# Patient Record
Sex: Male | Born: 1975 | Hispanic: Yes | Marital: Single | Smoking: Never smoker
Health system: Southern US, Community
[De-identification: ages and names within clinical notes are randomized; demographics above are authoritative.]

## PROBLEM LIST (undated history)

## (undated) HISTORY — PX: CHOLECYSTECTOMY: SHX55

---

## 2013-10-15 ENCOUNTER — Inpatient Hospital Stay: Payer: Self-pay | Admitting: Surgery

## 2013-10-15 LAB — CBC WITH DIFFERENTIAL/PLATELET
Basophil #: 0.1 10*3/uL (ref 0.0–0.1)
Basophil %: 0.4 %
Eosinophil #: 0.1 10*3/uL (ref 0.0–0.7)
Eosinophil %: 0.4 %
HCT: 46.7 % (ref 40.0–52.0)
HGB: 15.4 g/dL (ref 13.0–18.0)
LYMPHS PCT: 9.6 %
Lymphocyte #: 1.2 10*3/uL (ref 1.0–3.6)
MCH: 28 pg (ref 26.0–34.0)
MCHC: 33 g/dL (ref 32.0–36.0)
MCV: 85 fL (ref 80–100)
Monocyte #: 0.6 x10 3/mm (ref 0.2–1.0)
Monocyte %: 5 %
Neutrophil #: 10.7 10*3/uL — ABNORMAL HIGH (ref 1.4–6.5)
Neutrophil %: 84.6 %
Platelet: 341 10*3/uL (ref 150–440)
RBC: 5.5 10*6/uL (ref 4.40–5.90)
RDW: 13.6 % (ref 11.5–14.5)
WBC: 12.7 10*3/uL — AB (ref 3.8–10.6)

## 2013-10-15 LAB — AMYLASE: Amylase: >1300 U/L — ABNORMAL HIGH (ref 25–115)

## 2013-10-15 LAB — URINALYSIS, COMPLETE
BACTERIA: NONE SEEN
Blood: NEGATIVE
Glucose,UR: NEGATIVE mg/dL (ref 0–75)
LEUKOCYTE ESTERASE: NEGATIVE
NITRITE: NEGATIVE
Ph: 5 (ref 4.5–8.0)
RBC,UR: <1 /HPF (ref 0–5)
Specific Gravity: 1.017 (ref 1.003–1.030)
Squamous Epithelial: <1
WBC UR: 2 /HPF (ref 0–5)

## 2013-10-15 LAB — COMPREHENSIVE METABOLIC PANEL
ALBUMIN: 4.2 g/dL (ref 3.4–5.0)
ALK PHOS: 218 U/L — AB
Anion Gap: 9 (ref 7–16)
BUN: 10 mg/dL (ref 7–18)
Bilirubin,Total: 5.3 mg/dL — ABNORMAL HIGH (ref 0.2–1.0)
CALCIUM: 9 mg/dL (ref 8.5–10.1)
CREATININE: 1.16 mg/dL (ref 0.60–1.30)
Chloride: 103 mmol/L (ref 98–107)
Co2: 24 mmol/L (ref 21–32)
EGFR (African American): >60
EGFR (Non-African Amer.): >60
Glucose: 140 mg/dL — ABNORMAL HIGH (ref 65–99)
OSMOLALITY: 273 (ref 275–301)
Potassium: 3.5 mmol/L (ref 3.5–5.1)
SGOT(AST): 158 U/L — ABNORMAL HIGH (ref 15–37)
SGPT (ALT): 337 U/L — ABNORMAL HIGH (ref 12–78)
SODIUM: 136 mmol/L (ref 136–145)
TOTAL PROTEIN: 8.2 g/dL (ref 6.4–8.2)

## 2013-10-15 LAB — LACTATE DEHYDROGENASE: LDH: 238 U/L (ref 85–241)

## 2013-10-15 LAB — TROPONIN I

## 2013-10-15 LAB — LIPASE, BLOOD: Lipase: >10000 U/L — ABNORMAL HIGH (ref 73–393)

## 2013-10-15 LAB — PROTIME-INR
INR: 0.9
Prothrombin Time: 12.1 secs (ref 11.5–14.7)

## 2013-10-16 LAB — COMPREHENSIVE METABOLIC PANEL
ALK PHOS: 211 U/L — AB
ANION GAP: 10 (ref 7–16)
Albumin: 3.7 g/dL (ref 3.4–5.0)
BILIRUBIN TOTAL: 6.6 mg/dL — AB (ref 0.2–1.0)
BUN: 14 mg/dL (ref 7–18)
CHLORIDE: 108 mmol/L — AB (ref 98–107)
CO2: 19 mmol/L — AB (ref 21–32)
Calcium, Total: 8.6 mg/dL (ref 8.5–10.1)
Creatinine: 1.42 mg/dL — ABNORMAL HIGH (ref 0.60–1.30)
EGFR (African American): >60
EGFR (Non-African Amer.): >60
Glucose: 114 mg/dL — ABNORMAL HIGH (ref 65–99)
Osmolality: 275 (ref 275–301)
Potassium: 3.8 mmol/L (ref 3.5–5.1)
SGOT(AST): 155 U/L — ABNORMAL HIGH (ref 15–37)
SGPT (ALT): 312 U/L — ABNORMAL HIGH (ref 12–78)
SODIUM: 137 mmol/L (ref 136–145)
Total Protein: 7.1 g/dL (ref 6.4–8.2)

## 2013-10-16 LAB — CBC WITH DIFFERENTIAL/PLATELET
BASOS PCT: 0.5 %
Basophil #: 0.1 10*3/uL (ref 0.0–0.1)
EOS ABS: 0 10*3/uL (ref 0.0–0.7)
EOS PCT: 0 %
HCT: 45.2 % (ref 40.0–52.0)
HGB: 14.5 g/dL (ref 13.0–18.0)
LYMPHS PCT: 3.6 %
Lymphocyte #: 0.7 10*3/uL — ABNORMAL LOW (ref 1.0–3.6)
MCH: 27.4 pg (ref 26.0–34.0)
MCHC: 32.1 g/dL (ref 32.0–36.0)
MCV: 85 fL (ref 80–100)
MONO ABS: 1.1 x10 3/mm — AB (ref 0.2–1.0)
Monocyte %: 6.1 %
Neutrophil #: 16.1 10*3/uL — ABNORMAL HIGH (ref 1.4–6.5)
Neutrophil %: 89.8 %
PLATELETS: 296 10*3/uL (ref 150–440)
RBC: 5.29 10*6/uL (ref 4.40–5.90)
RDW: 13.8 % (ref 11.5–14.5)
WBC: 18 10*3/uL — ABNORMAL HIGH (ref 3.8–10.6)

## 2013-10-16 LAB — MAGNESIUM: Magnesium: 1.7 mg/dL — ABNORMAL LOW

## 2013-10-16 LAB — LIPASE, BLOOD: Lipase: >10000 U/L — ABNORMAL HIGH (ref 73–393)

## 2013-10-16 LAB — AMYLASE: Amylase: 859 U/L — ABNORMAL HIGH (ref 25–115)

## 2013-10-17 LAB — CBC WITH DIFFERENTIAL/PLATELET
BASOS ABS: 0 10*3/uL (ref 0.0–0.1)
BASOS PCT: 0.3 %
EOS ABS: 0 10*3/uL (ref 0.0–0.7)
EOS PCT: 0 %
HCT: 40 % (ref 40.0–52.0)
HGB: 12.8 g/dL — AB (ref 13.0–18.0)
LYMPHS ABS: 0.9 10*3/uL — AB (ref 1.0–3.6)
Lymphocyte %: 6.7 %
MCH: 27.5 pg (ref 26.0–34.0)
MCHC: 32 g/dL (ref 32.0–36.0)
MCV: 86 fL (ref 80–100)
Monocyte #: 1 x10 3/mm (ref 0.2–1.0)
Monocyte %: 7 %
NEUTROS ABS: 12 10*3/uL — AB (ref 1.4–6.5)
Neutrophil %: 86 %
PLATELETS: 198 10*3/uL (ref 150–440)
RBC: 4.66 10*6/uL (ref 4.40–5.90)
RDW: 14.2 % (ref 11.5–14.5)
WBC: 14 10*3/uL — AB (ref 3.8–10.6)

## 2013-10-17 LAB — COMPREHENSIVE METABOLIC PANEL
ANION GAP: 5 — AB (ref 7–16)
AST: 75 U/L — AB (ref 15–37)
Albumin: 2.9 g/dL — ABNORMAL LOW (ref 3.4–5.0)
Alkaline Phosphatase: 167 U/L — ABNORMAL HIGH
BILIRUBIN TOTAL: 7.8 mg/dL — AB (ref 0.2–1.0)
BUN: 8 mg/dL (ref 7–18)
CHLORIDE: 104 mmol/L (ref 98–107)
Calcium, Total: 7.9 mg/dL — ABNORMAL LOW (ref 8.5–10.1)
Co2: 27 mmol/L (ref 21–32)
Creatinine: 1.08 mg/dL (ref 0.60–1.30)
EGFR (African American): >60
Glucose: 104 mg/dL — ABNORMAL HIGH (ref 65–99)
OSMOLALITY: 271 (ref 275–301)
Potassium: 3.7 mmol/L (ref 3.5–5.1)
SGPT (ALT): 207 U/L — ABNORMAL HIGH (ref 12–78)
Sodium: 136 mmol/L (ref 136–145)
TOTAL PROTEIN: 6.5 g/dL (ref 6.4–8.2)

## 2013-10-17 LAB — AMYLASE: AMYLASE: 410 U/L — AB (ref 25–115)

## 2013-10-17 LAB — LACTATE DEHYDROGENASE: LDH: 215 U/L (ref 85–241)

## 2013-10-18 LAB — CBC WITH DIFFERENTIAL/PLATELET
Basophil #: 0 10*3/uL (ref 0.0–0.1)
Basophil %: 0.3 %
EOS ABS: 0 10*3/uL (ref 0.0–0.7)
EOS PCT: 0 %
HCT: 38.4 % — AB (ref 40.0–52.0)
HGB: 12.8 g/dL — AB (ref 13.0–18.0)
LYMPHS PCT: 6 %
Lymphocyte #: 0.9 10*3/uL — ABNORMAL LOW (ref 1.0–3.6)
MCH: 28.3 pg (ref 26.0–34.0)
MCHC: 33.4 g/dL (ref 32.0–36.0)
MCV: 85 fL (ref 80–100)
MONOS PCT: 7.8 %
Monocyte #: 1.1 x10 3/mm — ABNORMAL HIGH (ref 0.2–1.0)
NEUTROS PCT: 85.9 %
Neutrophil #: 12.4 10*3/uL — ABNORMAL HIGH (ref 1.4–6.5)
Platelet: 199 10*3/uL (ref 150–440)
RBC: 4.53 10*6/uL (ref 4.40–5.90)
RDW: 14.3 % (ref 11.5–14.5)
WBC: 14.4 10*3/uL — AB (ref 3.8–10.6)

## 2013-10-18 LAB — COMPREHENSIVE METABOLIC PANEL
ALK PHOS: 159 U/L — AB
AST: 48 U/L — AB (ref 15–37)
Albumin: 2.7 g/dL — ABNORMAL LOW (ref 3.4–5.0)
Anion Gap: 6 — ABNORMAL LOW (ref 7–16)
BUN: 8 mg/dL (ref 7–18)
Bilirubin,Total: 4.9 mg/dL — ABNORMAL HIGH (ref 0.2–1.0)
CHLORIDE: 102 mmol/L (ref 98–107)
CO2: 27 mmol/L (ref 21–32)
CREATININE: 1.05 mg/dL (ref 0.60–1.30)
Calcium, Total: 7.9 mg/dL — ABNORMAL LOW (ref 8.5–10.1)
Glucose: 100 mg/dL — ABNORMAL HIGH (ref 65–99)
Osmolality: 269 (ref 275–301)
Potassium: 3.4 mmol/L — ABNORMAL LOW (ref 3.5–5.1)
SGPT (ALT): 147 U/L — ABNORMAL HIGH (ref 12–78)
Sodium: 135 mmol/L — ABNORMAL LOW (ref 136–145)
TOTAL PROTEIN: 6.9 g/dL (ref 6.4–8.2)

## 2013-10-18 LAB — LIPASE, BLOOD: Lipase: 1087 U/L — ABNORMAL HIGH (ref 73–393)

## 2013-10-19 LAB — COMPREHENSIVE METABOLIC PANEL
ALK PHOS: 134 U/L — AB
AST: 33 U/L (ref 15–37)
Albumin: 2.6 g/dL — ABNORMAL LOW (ref 3.4–5.0)
Anion Gap: 7 (ref 7–16)
BUN: 8 mg/dL (ref 7–18)
Bilirubin,Total: 2.7 mg/dL — ABNORMAL HIGH (ref 0.2–1.0)
Calcium, Total: 8.1 mg/dL — ABNORMAL LOW (ref 8.5–10.1)
Chloride: 99 mmol/L (ref 98–107)
Co2: 30 mmol/L (ref 21–32)
Creatinine: 0.9 mg/dL (ref 0.60–1.30)
EGFR (African American): >60
EGFR (Non-African Amer.): >60
Glucose: 88 mg/dL (ref 65–99)
OSMOLALITY: 270 (ref 275–301)
POTASSIUM: 2.8 mmol/L — AB (ref 3.5–5.1)
SGPT (ALT): 109 U/L — ABNORMAL HIGH (ref 12–78)
SODIUM: 136 mmol/L (ref 136–145)
Total Protein: 7.1 g/dL (ref 6.4–8.2)

## 2013-10-19 LAB — CBC WITH DIFFERENTIAL/PLATELET
BASOS ABS: 0.1 10*3/uL (ref 0.0–0.1)
Basophil %: 0.4 %
Eosinophil #: 0.1 10*3/uL (ref 0.0–0.7)
Eosinophil %: 0.9 %
HCT: 35.4 % — ABNORMAL LOW (ref 40.0–52.0)
HGB: 11.8 g/dL — AB (ref 13.0–18.0)
LYMPHS ABS: 1.2 10*3/uL (ref 1.0–3.6)
LYMPHS PCT: 9.7 %
MCH: 28.4 pg (ref 26.0–34.0)
MCHC: 33.3 g/dL (ref 32.0–36.0)
MCV: 85 fL (ref 80–100)
MONO ABS: 1.2 x10 3/mm — AB (ref 0.2–1.0)
Monocyte %: 9.3 %
NEUTROS ABS: 9.9 10*3/uL — AB (ref 1.4–6.5)
NEUTROS PCT: 79.7 %
Platelet: 227 10*3/uL (ref 150–440)
RBC: 4.15 10*6/uL — ABNORMAL LOW (ref 4.40–5.90)
RDW: 13.8 % (ref 11.5–14.5)
WBC: 12.4 10*3/uL — ABNORMAL HIGH (ref 3.8–10.6)

## 2013-10-19 LAB — POTASSIUM: Potassium: 3 mmol/L — ABNORMAL LOW (ref 3.5–5.1)

## 2013-10-19 LAB — LIPASE, BLOOD: Lipase: 414 U/L — ABNORMAL HIGH (ref 73–393)

## 2013-10-20 LAB — CBC WITH DIFFERENTIAL/PLATELET
BASOS ABS: 0.1 10*3/uL (ref 0.0–0.1)
BASOS PCT: 0.5 %
Eosinophil #: 0.3 10*3/uL (ref 0.0–0.7)
Eosinophil %: 2.8 %
HCT: 34 % — AB (ref 40.0–52.0)
HGB: 11.1 g/dL — AB (ref 13.0–18.0)
LYMPHS PCT: 10.7 %
Lymphocyte #: 1.3 10*3/uL (ref 1.0–3.6)
MCH: 27.9 pg (ref 26.0–34.0)
MCHC: 32.6 g/dL (ref 32.0–36.0)
MCV: 86 fL (ref 80–100)
MONOS PCT: 9.4 %
Monocyte #: 1.1 x10 3/mm — ABNORMAL HIGH (ref 0.2–1.0)
NEUTROS PCT: 76.6 %
Neutrophil #: 9.3 10*3/uL — ABNORMAL HIGH (ref 1.4–6.5)
PLATELETS: 245 10*3/uL (ref 150–440)
RBC: 3.97 10*6/uL — ABNORMAL LOW (ref 4.40–5.90)
RDW: 14 % (ref 11.5–14.5)
WBC: 12.1 10*3/uL — ABNORMAL HIGH (ref 3.8–10.6)

## 2013-10-20 LAB — COMPREHENSIVE METABOLIC PANEL
ALBUMIN: 2.5 g/dL — AB (ref 3.4–5.0)
ALK PHOS: 112 U/L
Anion Gap: 6 — ABNORMAL LOW (ref 7–16)
BUN: 6 mg/dL — ABNORMAL LOW (ref 7–18)
Bilirubin,Total: 1.6 mg/dL — ABNORMAL HIGH (ref 0.2–1.0)
CO2: 30 mmol/L (ref 21–32)
Calcium, Total: 7.8 mg/dL — ABNORMAL LOW (ref 8.5–10.1)
Chloride: 102 mmol/L (ref 98–107)
Creatinine: 0.91 mg/dL (ref 0.60–1.30)
EGFR (African American): >60
EGFR (Non-African Amer.): >60
Glucose: 120 mg/dL — ABNORMAL HIGH (ref 65–99)
Osmolality: 274 (ref 275–301)
POTASSIUM: 3 mmol/L — AB (ref 3.5–5.1)
SGOT(AST): 33 U/L (ref 15–37)
SGPT (ALT): 83 U/L — ABNORMAL HIGH (ref 12–78)
Sodium: 138 mmol/L (ref 136–145)
TOTAL PROTEIN: 6.8 g/dL (ref 6.4–8.2)

## 2013-10-20 LAB — LIPASE, BLOOD: Lipase: 396 U/L — ABNORMAL HIGH (ref 73–393)

## 2013-10-20 LAB — CLOSTRIDIUM DIFFICILE(ARMC)

## 2013-10-20 LAB — PATHOLOGY REPORT

## 2013-10-21 LAB — CBC WITH DIFFERENTIAL/PLATELET
Basophil #: 0.1 10*3/uL
Basophil %: 0.7 %
Eosinophil #: 0.3 10*3/uL
Eosinophil %: 2.7 %
HCT: 32.9 % — ABNORMAL LOW
HGB: 10.9 g/dL — ABNORMAL LOW
Lymphocyte %: 6.3 %
Lymphs Abs: 0.8 10*3/uL — ABNORMAL LOW
MCH: 28.3 pg
MCHC: 33 g/dL
MCV: 86 fL
Monocyte #: 0.9 10*3/uL
Monocyte %: 7.3 %
Neutrophil #: 10.6 10*3/uL — ABNORMAL HIGH
Neutrophil %: 83 %
Platelet: 257 10*3/uL
RBC: 3.85 x10 6/mm 3 — ABNORMAL LOW
RDW: 13.6 %
WBC: 12.7 10*3/uL — ABNORMAL HIGH

## 2013-10-21 LAB — COMPREHENSIVE METABOLIC PANEL
ALBUMIN: 2.2 g/dL — AB (ref 3.4–5.0)
ALT: 70 U/L (ref 12–78)
AST: 33 U/L (ref 15–37)
Alkaline Phosphatase: 100 U/L
Anion Gap: 8 (ref 7–16)
BUN: 5 mg/dL — ABNORMAL LOW (ref 7–18)
Bilirubin,Total: 1.2 mg/dL — ABNORMAL HIGH (ref 0.2–1.0)
CHLORIDE: 99 mmol/L (ref 98–107)
CREATININE: 0.85 mg/dL (ref 0.60–1.30)
Calcium, Total: 8.1 mg/dL — ABNORMAL LOW (ref 8.5–10.1)
Co2: 28 mmol/L (ref 21–32)
EGFR (African American): >60
EGFR (Non-African Amer.): >60
GLUCOSE: 97 mg/dL (ref 65–99)
Osmolality: 267 (ref 275–301)
Potassium: 3.1 mmol/L — ABNORMAL LOW (ref 3.5–5.1)
SODIUM: 135 mmol/L — AB (ref 136–145)
Total Protein: 6.5 g/dL (ref 6.4–8.2)

## 2013-10-21 LAB — LIPASE, BLOOD: Lipase: 397 U/L — ABNORMAL HIGH (ref 73–393)

## 2013-10-22 LAB — LIPASE, BLOOD: Lipase: 463 U/L — ABNORMAL HIGH (ref 73–393)

## 2013-10-22 LAB — CBC WITH DIFFERENTIAL/PLATELET
Basophil #: 0.1 10*3/uL (ref 0.0–0.1)
Basophil %: 0.4 %
Eosinophil #: 0.4 10*3/uL (ref 0.0–0.7)
Eosinophil %: 2.8 %
HCT: 31.2 % — ABNORMAL LOW (ref 40.0–52.0)
HGB: 10.2 g/dL — AB (ref 13.0–18.0)
LYMPHS ABS: 1.4 10*3/uL (ref 1.0–3.6)
Lymphocyte %: 9.3 %
MCH: 28.1 pg (ref 26.0–34.0)
MCHC: 32.7 g/dL (ref 32.0–36.0)
MCV: 86 fL (ref 80–100)
MONO ABS: 1.3 x10 3/mm — AB (ref 0.2–1.0)
Monocyte %: 8.9 %
NEUTROS PCT: 78.6 %
Neutrophil #: 11.8 10*3/uL — ABNORMAL HIGH (ref 1.4–6.5)
PLATELETS: 297 10*3/uL (ref 150–440)
RBC: 3.63 10*6/uL — AB (ref 4.40–5.90)
RDW: 13.7 % (ref 11.5–14.5)
WBC: 15 10*3/uL — ABNORMAL HIGH (ref 3.8–10.6)

## 2013-10-22 LAB — COMPREHENSIVE METABOLIC PANEL
ALBUMIN: 2.3 g/dL — AB (ref 3.4–5.0)
ALK PHOS: 96 U/L
ALT: 56 U/L (ref 12–78)
Anion Gap: 6 — ABNORMAL LOW (ref 7–16)
BILIRUBIN TOTAL: 1.2 mg/dL — AB (ref 0.2–1.0)
BUN: 7 mg/dL (ref 7–18)
Calcium, Total: 8.7 mg/dL (ref 8.5–10.1)
Chloride: 99 mmol/L (ref 98–107)
Co2: 31 mmol/L (ref 21–32)
Creatinine: 1.12 mg/dL (ref 0.60–1.30)
EGFR (Non-African Amer.): >60
Glucose: 98 mg/dL (ref 65–99)
Osmolality: 270 (ref 275–301)
POTASSIUM: 3.3 mmol/L — AB (ref 3.5–5.1)
SGOT(AST): 22 U/L (ref 15–37)
Sodium: 136 mmol/L (ref 136–145)
Total Protein: 6.8 g/dL (ref 6.4–8.2)

## 2013-10-23 LAB — COMPREHENSIVE METABOLIC PANEL
ALBUMIN: 2.3 g/dL — AB (ref 3.4–5.0)
ANION GAP: 7 (ref 7–16)
AST: 26 U/L (ref 15–37)
Alkaline Phosphatase: 94 U/L
BUN: 7 mg/dL (ref 7–18)
Bilirubin,Total: 1 mg/dL (ref 0.2–1.0)
Calcium, Total: 8.7 mg/dL (ref 8.5–10.1)
Chloride: 99 mmol/L (ref 98–107)
Co2: 29 mmol/L (ref 21–32)
Creatinine: 0.97 mg/dL (ref 0.60–1.30)
EGFR (African American): >60
EGFR (Non-African Amer.): >60
GLUCOSE: 98 mg/dL (ref 65–99)
Osmolality: 268 (ref 275–301)
POTASSIUM: 3.2 mmol/L — AB (ref 3.5–5.1)
SGPT (ALT): 55 U/L (ref 12–78)
Sodium: 135 mmol/L — ABNORMAL LOW (ref 136–145)
TOTAL PROTEIN: 7 g/dL (ref 6.4–8.2)

## 2013-10-23 LAB — CBC WITH DIFFERENTIAL/PLATELET
BASOS ABS: 0.1 10*3/uL (ref 0.0–0.1)
Basophil %: 0.3 %
Eosinophil #: 0.5 10*3/uL (ref 0.0–0.7)
Eosinophil %: 3.1 %
HCT: 33.3 % — ABNORMAL LOW (ref 40.0–52.0)
HGB: 10.9 g/dL — ABNORMAL LOW (ref 13.0–18.0)
Lymphocyte #: 1.4 10*3/uL (ref 1.0–3.6)
Lymphocyte %: 9 %
MCH: 28.1 pg (ref 26.0–34.0)
MCHC: 32.6 g/dL (ref 32.0–36.0)
MCV: 86 fL (ref 80–100)
MONO ABS: 1.2 x10 3/mm — AB (ref 0.2–1.0)
Monocyte %: 7.9 %
Neutrophil #: 12.5 10*3/uL — ABNORMAL HIGH (ref 1.4–6.5)
Neutrophil %: 79.7 %
PLATELETS: 381 10*3/uL (ref 150–440)
RBC: 3.87 10*6/uL — ABNORMAL LOW (ref 4.40–5.90)
RDW: 13.7 % (ref 11.5–14.5)
WBC: 15.7 10*3/uL — ABNORMAL HIGH (ref 3.8–10.6)

## 2013-10-23 LAB — LIPASE, BLOOD: Lipase: 525 U/L — ABNORMAL HIGH (ref 73–393)

## 2013-10-24 LAB — CBC WITH DIFFERENTIAL/PLATELET
BASOS PCT: 0.3 %
Basophil #: 0 10*3/uL (ref 0.0–0.1)
Eosinophil #: 0.6 10*3/uL (ref 0.0–0.7)
Eosinophil %: 4 %
HCT: 31.4 % — ABNORMAL LOW (ref 40.0–52.0)
HGB: 10.4 g/dL — ABNORMAL LOW (ref 13.0–18.0)
LYMPHS ABS: 1.8 10*3/uL (ref 1.0–3.6)
Lymphocyte %: 12.6 %
MCH: 28.3 pg (ref 26.0–34.0)
MCHC: 33.2 g/dL (ref 32.0–36.0)
MCV: 85 fL (ref 80–100)
MONO ABS: 1.4 x10 3/mm — AB (ref 0.2–1.0)
Monocyte %: 9.8 %
Neutrophil #: 10.4 10*3/uL — ABNORMAL HIGH (ref 1.4–6.5)
Neutrophil %: 73.3 %
Platelet: 409 10*3/uL (ref 150–440)
RBC: 3.68 10*6/uL — ABNORMAL LOW (ref 4.40–5.90)
RDW: 14 % (ref 11.5–14.5)
WBC: 14.2 10*3/uL — AB (ref 3.8–10.6)

## 2013-10-24 LAB — COMPREHENSIVE METABOLIC PANEL
ALBUMIN: 2.4 g/dL — AB (ref 3.4–5.0)
ALK PHOS: 83 U/L
AST: 39 U/L — AB (ref 15–37)
Anion Gap: 5 — ABNORMAL LOW (ref 7–16)
BUN: 8 mg/dL (ref 7–18)
Bilirubin,Total: 0.9 mg/dL (ref 0.2–1.0)
CO2: 29 mmol/L (ref 21–32)
CREATININE: 0.98 mg/dL (ref 0.60–1.30)
Calcium, Total: 8.6 mg/dL (ref 8.5–10.1)
Chloride: 101 mmol/L (ref 98–107)
Glucose: 106 mg/dL — ABNORMAL HIGH (ref 65–99)
Osmolality: 269 (ref 275–301)
Potassium: 3.6 mmol/L (ref 3.5–5.1)
SGPT (ALT): 65 U/L (ref 12–78)
Sodium: 135 mmol/L — ABNORMAL LOW (ref 136–145)
TOTAL PROTEIN: 7 g/dL (ref 6.4–8.2)

## 2013-10-24 LAB — LIPASE, BLOOD: LIPASE: 595 U/L — AB (ref 73–393)

## 2013-10-28 ENCOUNTER — Other Ambulatory Visit: Payer: Self-pay | Admitting: Surgery

## 2013-10-28 LAB — CBC WITH DIFFERENTIAL/PLATELET
Eosinophil: 3 %
HCT: 38.6 % — ABNORMAL LOW (ref 40.0–52.0)
HGB: 12.9 g/dL — ABNORMAL LOW (ref 13.0–18.0)
Lymphocytes: 19 %
MCH: 28.2 pg (ref 26.0–34.0)
MCHC: 33.3 g/dL (ref 32.0–36.0)
MCV: 85 fL (ref 80–100)
MONOS PCT: 10 %
Platelet: 645 10*3/uL — ABNORMAL HIGH (ref 150–440)
RBC: 4.57 10*6/uL (ref 4.40–5.90)
RDW: 13.5 % (ref 11.5–14.5)
Segmented Neutrophils: 68 %
WBC: 6.9 10*3/uL (ref 3.8–10.6)

## 2013-10-28 LAB — LIPASE, BLOOD: Lipase: 682 U/L — ABNORMAL HIGH (ref 73–393)

## 2013-10-28 LAB — BASIC METABOLIC PANEL
Anion Gap: 6 — ABNORMAL LOW (ref 7–16)
BUN: 10 mg/dL (ref 7–18)
CO2: 27 mmol/L (ref 21–32)
CREATININE: 0.95 mg/dL (ref 0.60–1.30)
Calcium, Total: 9.4 mg/dL (ref 8.5–10.1)
Chloride: 103 mmol/L (ref 98–107)
Glucose: 118 mg/dL — ABNORMAL HIGH (ref 65–99)
Osmolality: 272 (ref 275–301)
Potassium: 4.3 mmol/L (ref 3.5–5.1)
Sodium: 136 mmol/L (ref 136–145)

## 2013-10-28 LAB — HEPATIC FUNCTION PANEL A (ARMC)
ALBUMIN: 3.4 g/dL (ref 3.4–5.0)
AST: 39 U/L — AB (ref 15–37)
Alkaline Phosphatase: 107 U/L
Bilirubin, Direct: 0.6 mg/dL — ABNORMAL HIGH (ref 0.00–0.20)
Bilirubin,Total: 0.8 mg/dL (ref 0.2–1.0)
SGPT (ALT): 58 U/L (ref 12–78)
Total Protein: 8.6 g/dL — ABNORMAL HIGH (ref 6.4–8.2)

## 2013-11-01 ENCOUNTER — Other Ambulatory Visit: Payer: Self-pay | Admitting: Surgery

## 2013-11-01 LAB — HEPATIC FUNCTION PANEL A (ARMC)
Albumin: 3.7 g/dL (ref 3.4–5.0)
Alkaline Phosphatase: 110 U/L
Bilirubin, Direct: 0.6 mg/dL — ABNORMAL HIGH (ref 0.00–0.20)
Bilirubin,Total: 0.8 mg/dL (ref 0.2–1.0)
SGOT(AST): 29 U/L (ref 15–37)
SGPT (ALT): 54 U/L (ref 12–78)
Total Protein: 8.7 g/dL — ABNORMAL HIGH (ref 6.4–8.2)

## 2013-11-01 LAB — BASIC METABOLIC PANEL
Anion Gap: 7 (ref 7–16)
BUN: 7 mg/dL (ref 7–18)
CREATININE: 1.07 mg/dL (ref 0.60–1.30)
Calcium, Total: 10 mg/dL (ref 8.5–10.1)
Chloride: 103 mmol/L (ref 98–107)
Co2: 27 mmol/L (ref 21–32)
EGFR (African American): >60
EGFR (Non-African Amer.): >60
GLUCOSE: 109 mg/dL — AB (ref 65–99)
OSMOLALITY: 272 (ref 275–301)
POTASSIUM: 4.3 mmol/L (ref 3.5–5.1)
SODIUM: 137 mmol/L (ref 136–145)

## 2013-11-01 LAB — CBC WITH DIFFERENTIAL/PLATELET
BASOS ABS: 0 10*3/uL (ref 0.0–0.1)
Basophil %: 0.8 %
EOS PCT: 5.9 %
Eosinophil #: 0.4 10*3/uL (ref 0.0–0.7)
HCT: 40 % (ref 40.0–52.0)
HGB: 13 g/dL (ref 13.0–18.0)
LYMPHS ABS: 2.2 10*3/uL (ref 1.0–3.6)
Lymphocyte %: 35 %
MCH: 28.1 pg (ref 26.0–34.0)
MCHC: 32.6 g/dL (ref 32.0–36.0)
MCV: 86 fL (ref 80–100)
MONO ABS: 0.4 x10 3/mm (ref 0.2–1.0)
Monocyte %: 6.3 %
NEUTROS ABS: 3.2 10*3/uL (ref 1.4–6.5)
NEUTROS PCT: 52 %
PLATELETS: 639 10*3/uL — AB (ref 150–440)
RBC: 4.65 10*6/uL (ref 4.40–5.90)
RDW: 13.7 % (ref 11.5–14.5)
WBC: 6.2 10*3/uL (ref 3.8–10.6)

## 2013-11-01 LAB — LIPASE, BLOOD: Lipase: 511 U/L — ABNORMAL HIGH (ref 73–393)

## 2013-11-08 ENCOUNTER — Other Ambulatory Visit: Payer: Self-pay | Admitting: Surgery

## 2013-11-08 LAB — CBC WITH DIFFERENTIAL/PLATELET
BASOS ABS: 0 10*3/uL (ref 0.0–0.1)
BASOS PCT: 1.3 %
EOS ABS: 0.1 10*3/uL (ref 0.0–0.7)
Eosinophil %: 3 %
HCT: 41 % (ref 40.0–52.0)
HGB: 13.2 g/dL (ref 13.0–18.0)
Lymphocyte #: 1.5 10*3/uL (ref 1.0–3.6)
Lymphocyte %: 39 %
MCH: 27.7 pg (ref 26.0–34.0)
MCHC: 32.3 g/dL (ref 32.0–36.0)
MCV: 86 fL (ref 80–100)
MONO ABS: 0.3 x10 3/mm (ref 0.2–1.0)
Monocyte %: 7.6 %
Neutrophil #: 1.8 10*3/uL (ref 1.4–6.5)
Neutrophil %: 49.1 %
Platelet: 343 10*3/uL (ref 150–440)
RBC: 4.79 10*6/uL (ref 4.40–5.90)
RDW: 14.6 % — ABNORMAL HIGH (ref 11.5–14.5)
WBC: 3.7 10*3/uL — AB (ref 3.8–10.6)

## 2013-11-08 LAB — LIPASE, BLOOD: Lipase: 258 U/L (ref 73–393)

## 2014-08-28 IMAGING — CR DG ABDOMEN 3V
1 series · 5 of 5 positions shown · non-contrast
Comparison: None.

CLINICAL DATA: Abdominal pain.

EXAM:
ABDOMEN SERIES

[Series 1: w chest pa · 0.14mm/px · 5 of 5 slices shown]
[im 1/5]
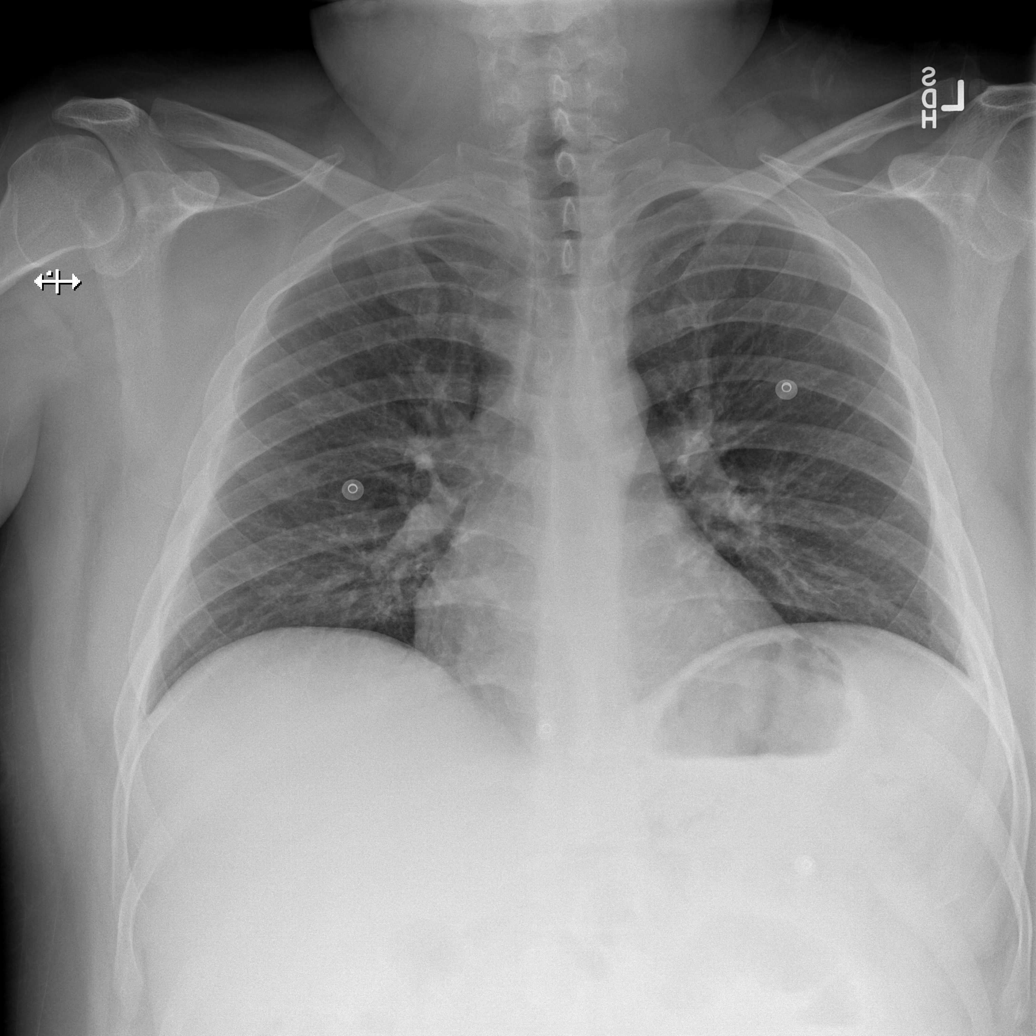
[im 2/5]
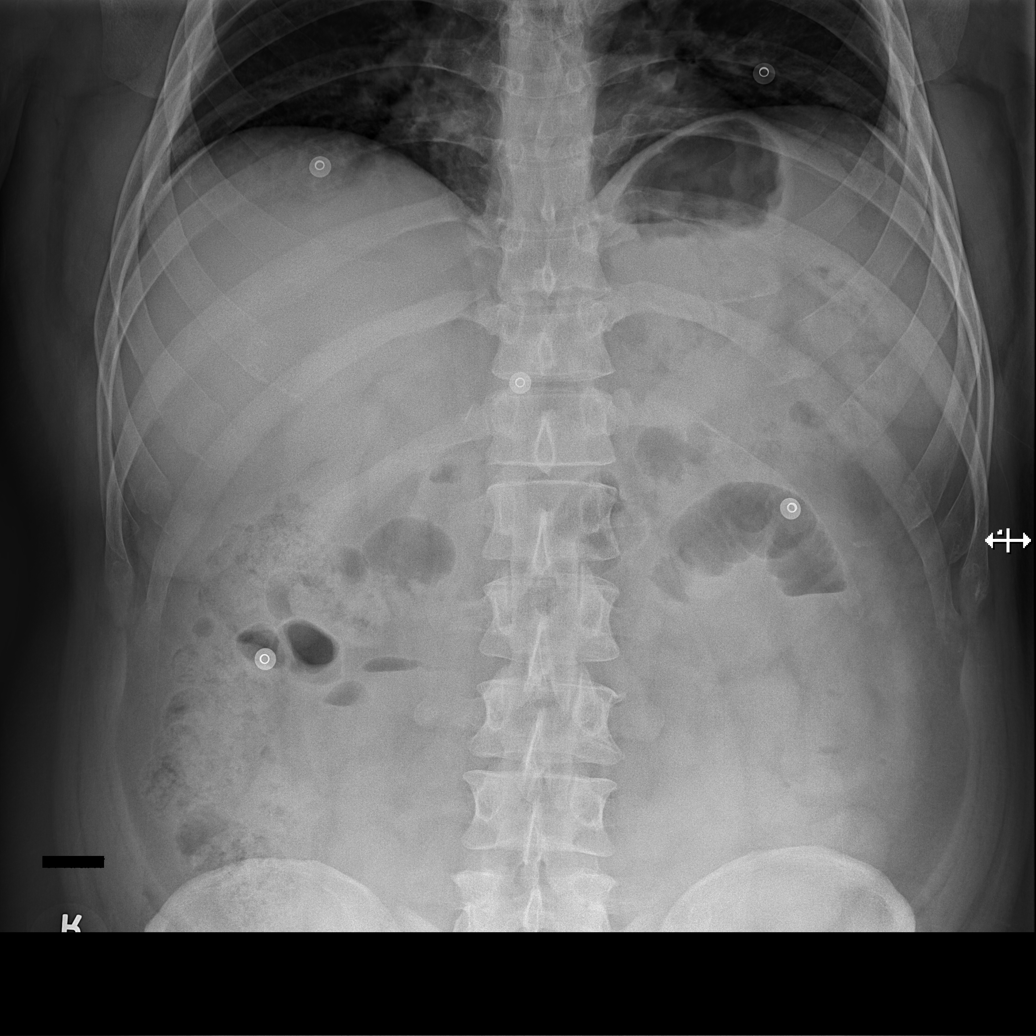
[im 3/5]
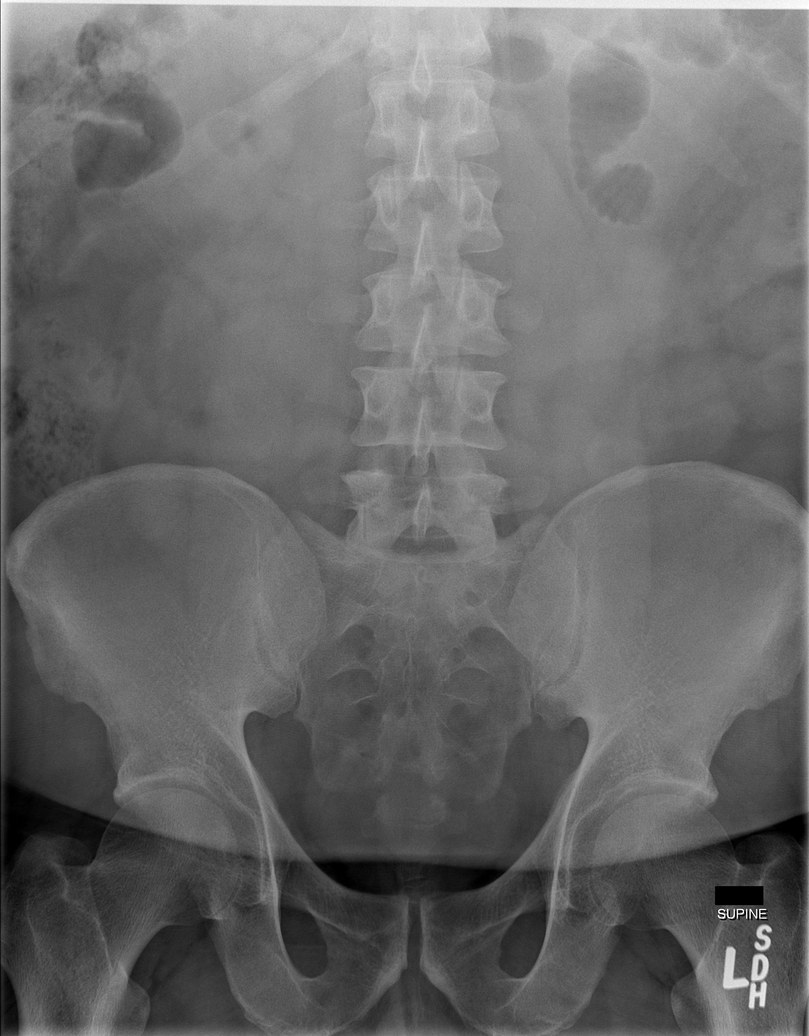
[im 4/5]
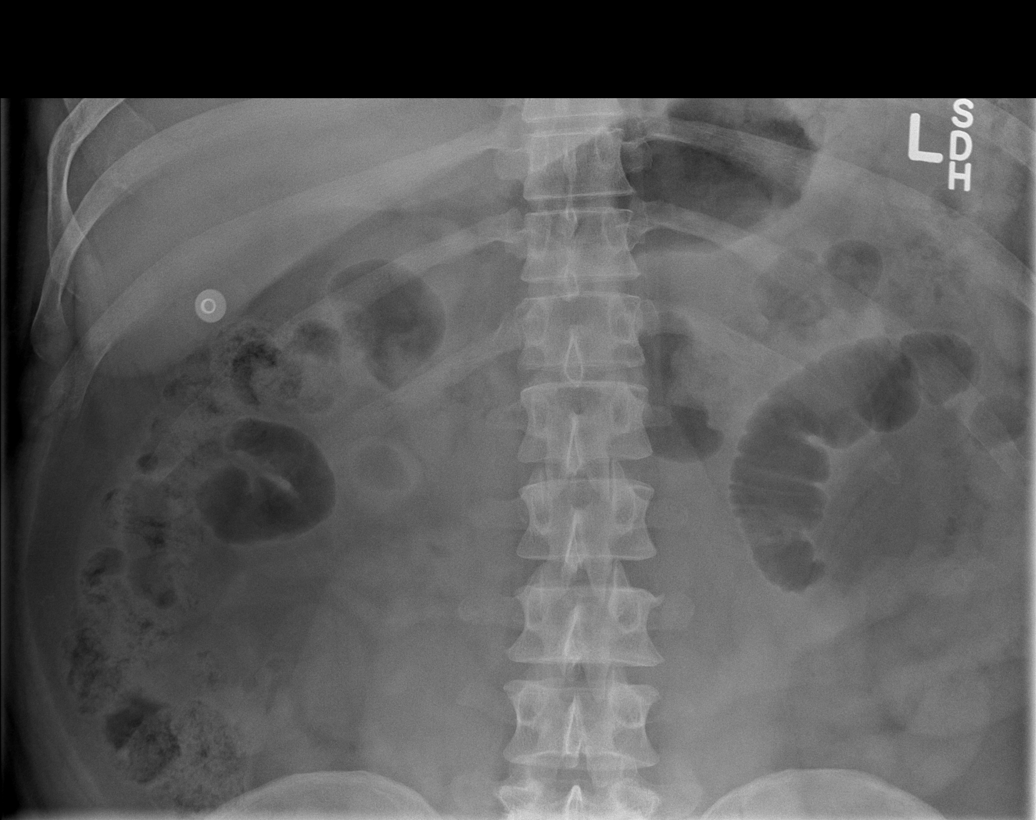
[im 5/5]
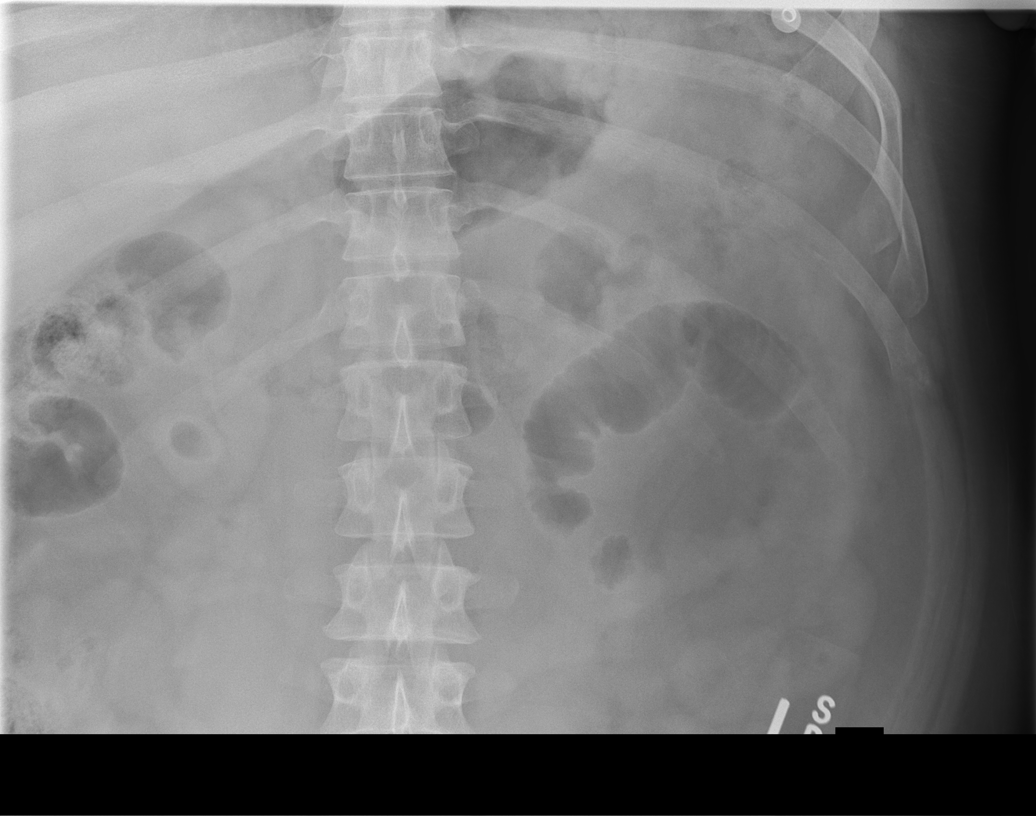

[5 of 5 positions shown; findings below may reference images not displayed]

FINDINGS: Low lung volumes. Normal cardiomediastinal silhouette. No
infiltrates or failure. No effusion or pneumothorax.

Slight prominence mid abdominal small bowel loop to the left of
midline could represent ileus. No visible obstruction or free air.
Normal osseous structures.
IMPRESSION: Low lung volumes. Possible ileus. No active infiltrates or
obstruction/free air.

## 2014-09-03 IMAGING — CT CT ABD-PELV W/ CM
2 of 5 series · 16 of 46 positions shown, 18 images · IV contrast (isovue)
Comparison: None.

CLINICAL DATA: Fever, pancreatitis, recent cholecystectomy

EXAM:
CT ABDOMEN AND PELVIS WITH CONTRAST
TECHNIQUE: Multidetector CT imaging of the abdomen and pelvis was performed
using the standard protocol following bolus administration of
intravenous contrast.
CONTRAST:  125 cc Isovue

[Series 2: routine abd pel with · axial · 0.98mm/px · z∈[-926,-476]mm · 13 of 104 slices shown, 15 images]
[im 7/104  soft-tissue]
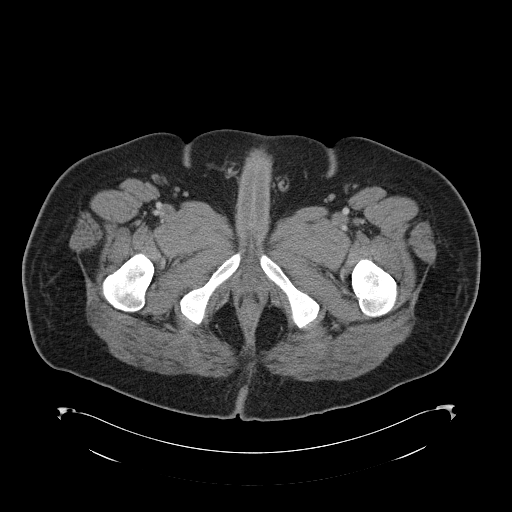
[im 7/104  bone]
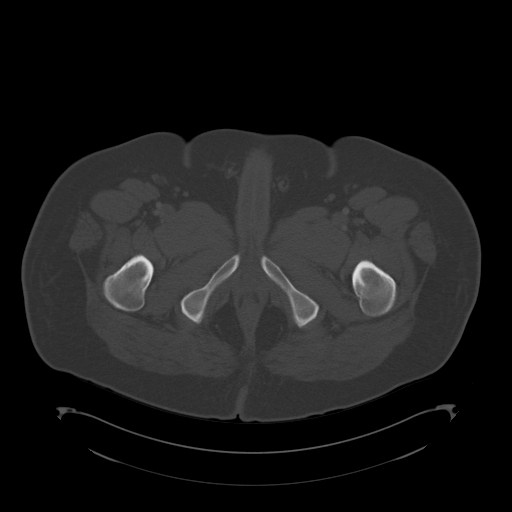
[im 13/104  soft-tissue]
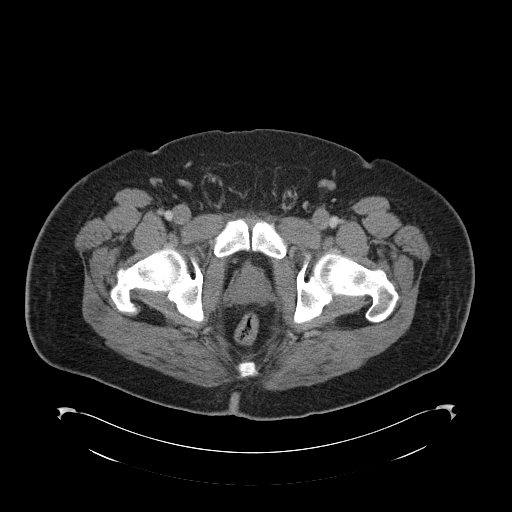
[im 20/104  soft-tissue]
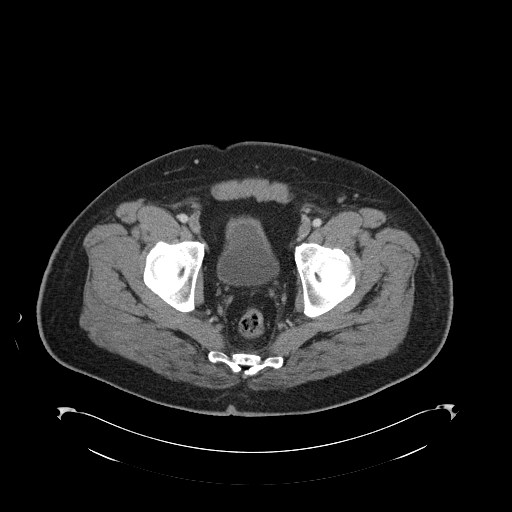
[im 33/104  soft-tissue]
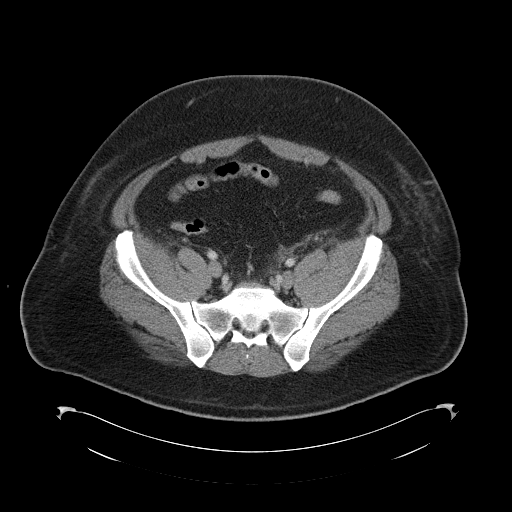
[im 39/104  soft-tissue]
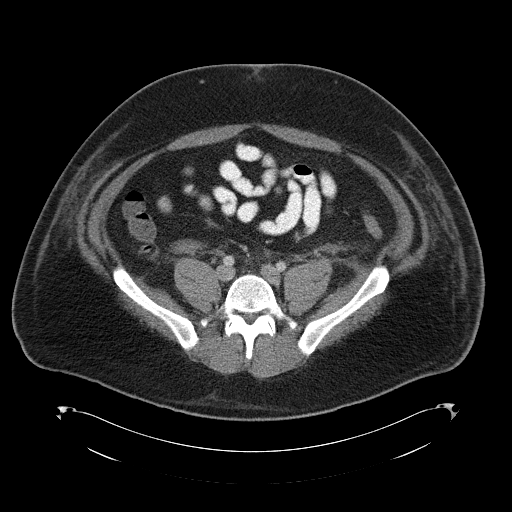
[im 46/104  soft-tissue]
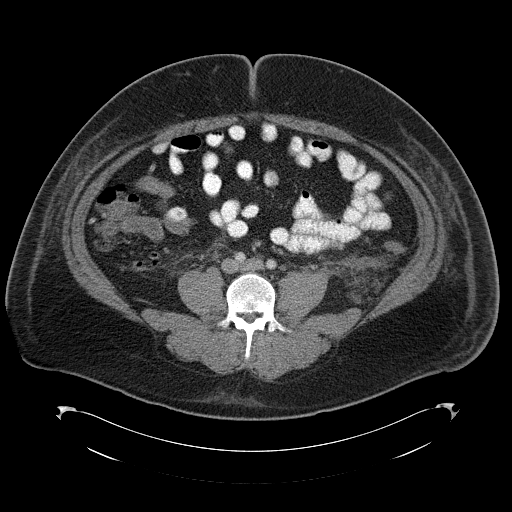
[im 52/104  soft-tissue]
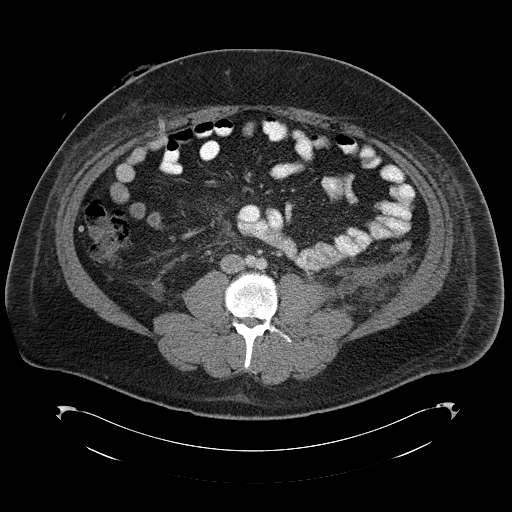
[im 58/104  soft-tissue]
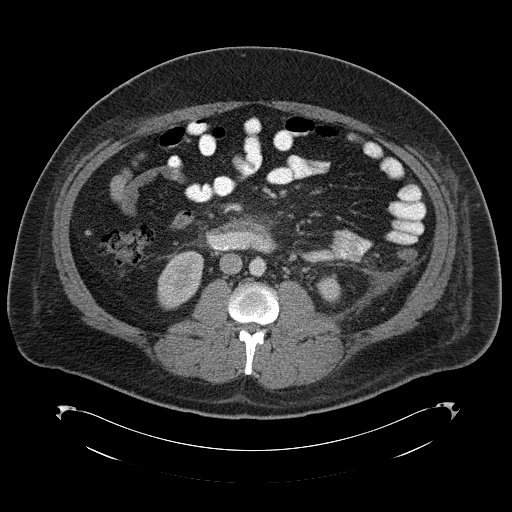
[im 65/104  soft-tissue]
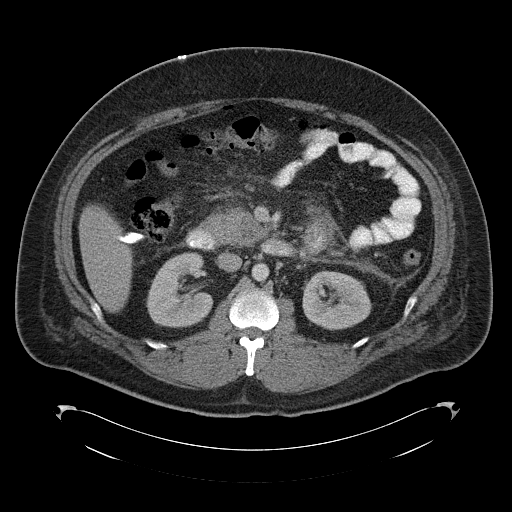
[im 65/104  bone]
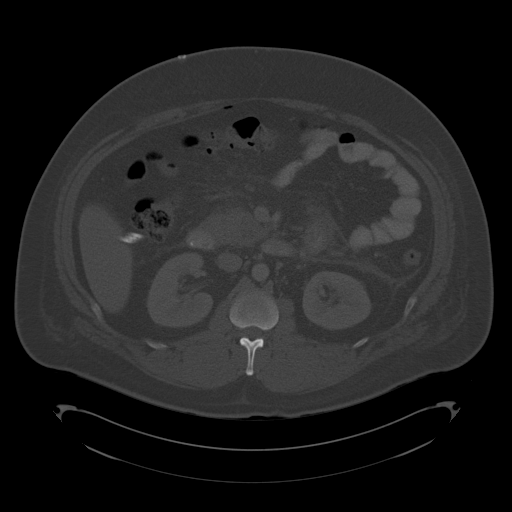
[im 71/104  soft-tissue]
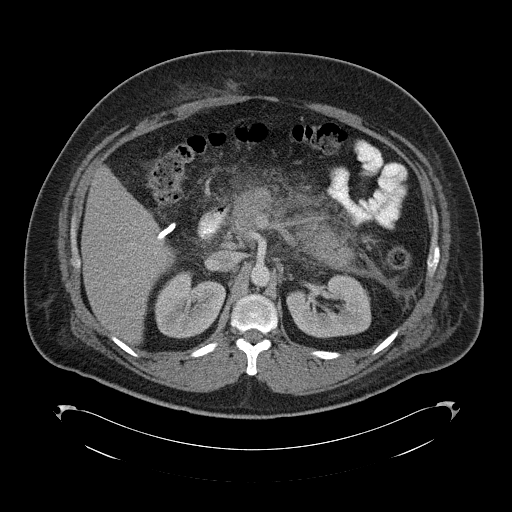
[im 84/104  soft-tissue]
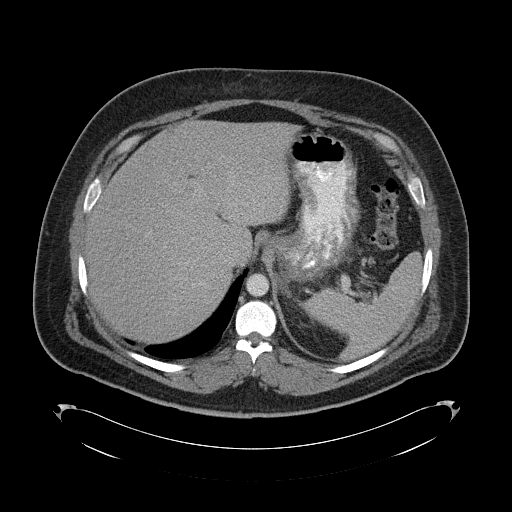
[im 91/104  soft-tissue]
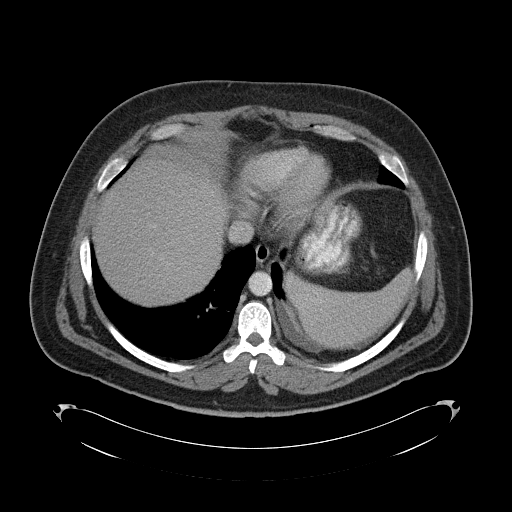
[im 97/104  soft-tissue]
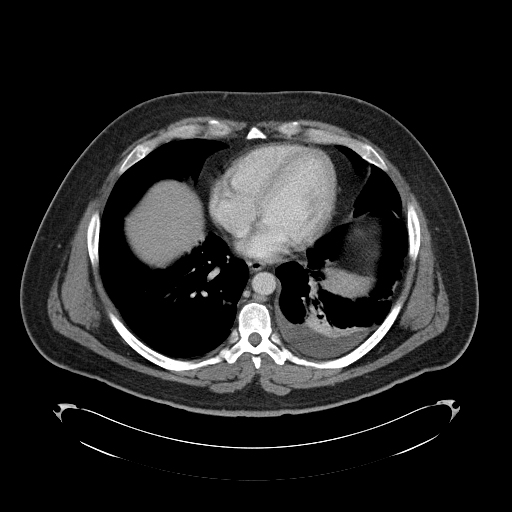

[Series 5: cor routine abd pel with · coronal · 0.92mm/px · 3 of 181 slices shown]
[im 61/181  soft-tissue]
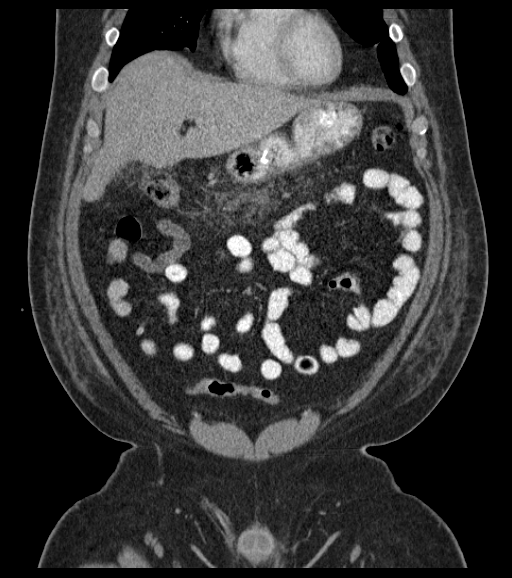
[im 81/181  soft-tissue]
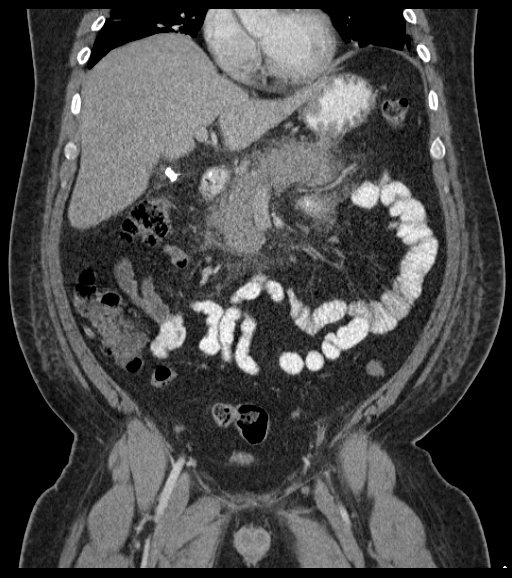
[im 101/181  soft-tissue]
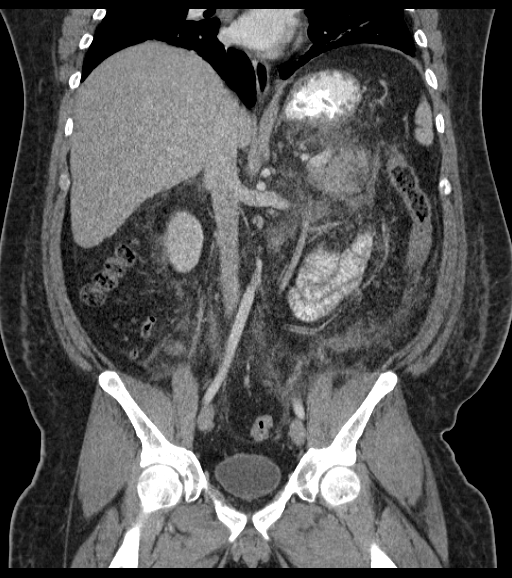

[16 of 46 positions shown; findings below may reference images not displayed]

FINDINGS: Sagittal images of the spine are unremarkable. Small amount of
subcutaneous air noted in umbilical region probable postsurgical in
nature. A surgical drain is noted in right upper abdomen with tip in
gallbladder fossa a. the patient is status postcholecystectomy.
There is small left pleural effusion with left base posterior
atelectasis or infiltrate.

No focal hepatic mass. No intrahepatic biliary ductal dilatation.
CBD measures 9 mm in diameter. The spleen and adrenal glands are
unremarkable. There is global swelling of the pancreas an decreased
enhancement. There is significant stranding of peripancreatic fat
and small amount of peripancreatic fluid. Findings are consistent
with significant acute pancreatitis. The splenic vein and arteries
patent. Portal vein and superior mesenteric vein is patent. SMA and
celiac trunk are patent. There is no pancreatic abscess or
pseudocyst. IVC appears patent. Kidneys are symmetrical in size and
enhancement. Small amount of fluid and stranding is noted in left
retroperitoneum. Normal appendix. No pericecal inflammation. No
pelvic free fluid in air. Prostate gland and seminal vesicles are
unremarkable.

There is no small bowel obstruction. There is mild thickening of the
small bowel wall in proximal jejunum just inferior to pancreas
probable due to adjacent pancreatic inflammation. Free fluid
anterior to left kidney measures about 2.3 cm.
IMPRESSION: 1. The patient is status postcholecystectomy. There is a
postsurgical drain with tip in gallbladder fossa. No abscess is
noted in gallbladder fossa .
2. There is global swelling and decreased enhancement of the
pancreas with stranding of surrounding fat consistent with
significant acute pancreatitis. Visualized vascular structures
appear patent. No pancreatic abscess or pseudocyst. Small amount of
peripancreatic fluid. Small amount of fluid in left retroperitoneum
and anterior to left kidney.
3. There is small left pleural effusion with left lower lobe
posterior atelectasis or infiltrate.
4. No small bowel obstruction. Mild thickening of proximal jejunal
wall probable due to adjacent pancreatic inflammation.
5. Normal appendix.  No pericecal inflammation.

## 2014-10-21 NOTE — Consult Note (Signed)
Brief Consult Note: Diagnosis: Gallstone pancreatitis.   Patient was seen by consultant.   Comments: Mr. Andre Lowery is a pleasant 39 y/o hispanic male with gallstone pancreatitis.  CBD 13mm on ultrasound with cholelithiasis.  ERCP with stone extraction & sphincterotomy with Dr Servando SnareWohl.  Discussed risks/benefits of procedure which include but are not limited to worsening pancreatitis, bleeding, infection, perforation & drug reaction.  Patient agrees with this plan & consent will be obtained.  Pt had ice chips early this AM & nothing else.  Plan: 1) NPO 2) Zosyn 3.375mg  q6hrs  3) ERCP today around 1700 Thanks for allowing us to participate in his care.  Please see full dictated note. #[id number].  Electronic Signatures: Joselyn ArrowJones, Khaleah Duer L (NP)  (Signed 20-Apr-15 12:51)  Authored: Brief Consult Note   Last Updated: 20-Apr-15 12:51 by Joselyn ArrowJones, Caysen Whang L (NP)

## 2014-10-21 NOTE — H&P (Signed)
History of Present Illness 49 yom who experienced transient epigastric pain 5 months ago, which resolved. 4 days ago he re-experienced this with nausea and vomiting. The nausea continued; the vomiting resolved. This AM he took 4 OTC colon cleansing tablets, had a BM, burped a lot, and his pain grossly exacerbated. He denies fever, itching, hematemesis, melena.   Past Med/Surgical Hx:  Denies medical history:   ALLERGIES:  No Known Allergies:   Family and Social History:  Family History Non-Contributory   Social History negative tobacco, positive ETOH, common-law married 13 years and lives with their 2 children: 33 and 84 (1 older lives with his ex), works as a Insurance account manager in [employer], doesn't smoke, drinks beer (5 - 6) only for rare special occasions (and none recently).   Place of Living Home   Review of Systems:  Fever/Chills No   Cough No   Sputum No   Abdominal Pain Yes   Diarrhea No   Constipation No   Nausea/Vomiting Yes   SOB/DOE No   Chest Pain No   Dysuria No   Tolerating PT Yes   Tolerating Diet No  Nauseated   Medications/Allergies Reviewed Medications/Allergies reviewed   Physical Exam:  GEN well developed, well nourished, obese   HEENT pink conjunctivae, PERRL, hearing intact to voice, dry oral mucosa, Oropharynx clear   NECK supple  No masses  trachea midline   RESP normal resp effort  clear BS  no use of accessory muscles   CARD regular rate  no murmur  no thrills  No LE edema  no JVD  no Rub   ABD denies tenderness  soft   LYMPH negative neck   EXTR negative cyanosis/clubbing, negative edema   SKIN normal to palpation, No ulcers, skin turgor good   NEURO cranial nerves intact, cranial nerves deficit, negative tremor, follows commands, motor/sensory function intact   PSYCH alert, A+O to time, place, person, good insight   Lab Results: Hepatic:  18-Apr-15 09:49   Bilirubin, Total  5.3  Alkaline Phosphatase  218 (45-117 NOTE: New  Reference Range 05/20/13)  SGPT (ALT)  337  SGOT (AST)  158  Total Protein, Serum 8.2  Albumin, Serum 4.2  Routine Chem:  18-Apr-15 09:49   LDH, Serum 238 (Result(s) reported on 15 Oct 2013 at 12:07PM.)  Glucose, Serum  140  BUN 10  Creatinine (comp) 1.16  Sodium, Serum 136  Potassium, Serum 3.5  Chloride, Serum 103  CO2, Serum 24  Calcium (Total), Serum 9.0  Osmolality (calc) 273  eGFR (African American) >60  eGFR (Non-African American) >60 (eGFR values <22m/min/1.73 m2 may be an indication of chronic kidney disease (CKD). Calculated eGFR is useful in patients with stable renal function. The eGFR calculation will not be reliable in acutely ill patients when serum creatinine is changing rapidly. It is not useful in  patients on dialysis. The eGFR calculation may not be applicable to patients at the low and high extremes of body sizes, pregnant women, and vegetarians.)  Anion Gap 9  Result Comment LIPASE - RESULT OBTAINED BY DILUTION  Result(s) reported on 15 Oct 2013 at 10:24AM.  Lipase  > 10000  Cardiac:  18-Apr-15 09:49   Troponin I < 0.02 (0.00-0.05 0.05 ng/mL or less: NEGATIVE  Repeat testing in 3-6 hrs  if clinically indicated. >0.05 ng/mL: POTENTIAL  MYOCARDIAL INJURY. Repeat  testing in 3-6 hrs if  clinically indicated. NOTE: An increase or decrease  of 30% or more on serial  testing suggests  a  clinically important change)  Routine Coag:  18-Apr-15 09:49   Prothrombin 12.1  INR 0.9 (INR reference interval applies to patients on anticoagulant therapy. A single INR therapeutic range for coumarins is not optimal for all indications; however, the suggested range for most indications is 2.0 - 3.0. Exceptions to the INR Reference Range may include: Prosthetic heart valves, acute myocardial infarction, prevention of myocardial infarction, and combinations of aspirin and anticoagulant. The need for a higher or lower target INR must be assessed  individually. Reference: The Pharmacology and Management of the Vitamin K  antagonists: the seventh ACCP Conference on Antithrombotic and Thrombolytic Therapy. MWNUU.7253 Sept:126 (3suppl): [id number]. A HCT value >55% may artifactually increase the PT.  In one study,  the increase was an average of 25%. Reference:  "Effect on Routine and Special Coagulation Testing Values of Citrate Anticoagulant Adjustment in Patients with High HCT Values." American Journal of Clinical Pathology 2006;126:400-405.)  Routine Hem:  18-Apr-15 09:49   WBC (CBC)  12.7  RBC (CBC) 5.50  Hemoglobin (CBC) 15.4  Hematocrit (CBC) 46.7  Platelet Count (CBC) 341  MCV 85  MCH 28.0  MCHC 33.0  RDW 13.6  Neutrophil % 84.6  Lymphocyte % 9.6  Monocyte % 5.0  Eosinophil % 0.4  Basophil % 0.4  Neutrophil #  10.7  Lymphocyte # 1.2  Monocyte # 0.6  Eosinophil # 0.1  Basophil # 0.1 (Result(s) reported on 15 Oct 2013 at 10:55AM.)   Radiology Results: XRay:    18-Apr-15 10:46, Abdomen 3 Way Includes PA Chest  Abdomen 3 Way Includes PA Chest  REASON FOR EXAM:    abdominal pain, constipation  COMMENTS:       PROCEDURE: DXR - DXR ABDOMEN 3-WAY (INCL PA CXR)  - Oct 15 2013 10:46AM     CLINICAL DATA:  Abdominal pain.    EXAM:  ABDOMEN SERIES    COMPARISON:  None.    FINDINGS:  Low lung volumes. Normal cardiomediastinal silhouette. No  infiltrates or failure. No effusion or pneumothorax.  Slight prominence mid abdominal small bowel loop to the left of  midline could represent ileus. No visible obstruction or free air.  Normal osseous structures.     IMPRESSION:  Low lung volumes. Possible ileus. No active infiltrates or  obstruction/free air.      Electronically Signed    By: Rolla Flatten M.D.    On: 10/15/2013 10:59         Verified By: Staci Righter, M.D.,  [place]:    18-Apr-15 11:31, US Abdomen Limited Survey  US Abdomen Limited Survey  REASON FOR EXAM:    RUQ pain, abnormal LFTs  COMMENTS:    Body Site: Gallbladder, Liver, Common Bile Duct    PROCEDURE: [place]  - US ABDOMEN LIMITED SURVEY  - Oct 15 2013 11:31AM     CLINICAL DATA:  Right upper quadrant abdominal pain. Abnormal liver  function tests.    EXAM:  US ABDOMEN LIMITED - RIGHT UPPER QUADRANT    COMPARISON:  No priors.    FINDINGS:  Gallbladder:  Echogenic material fills the gallbladder, with additional more focal  echogenic areas associated with posterior acoustic shadowing,  compatible with a combination of biliary sludge and multiple  gallstones. The largest gallstone measures approximately 2.5 cm in  diameter. Gallbladder is moderately distended. Gallbladder wall  thickness is within normal limits at 2.4 mm. No pericholecystic  fluid. Per report from the sonographer, the patient did not exhibit  a sonographic Murphy's sign  on examination.    Common bile duct:    Diameter: Dilated measuring up to 13 mm in the porta hepatis.    Liver:  No focal lesion identified. Within normal limits in parenchymal  echogenicity.     IMPRESSION:  1. Study is positive for cholelithiasis. No current findings to  suggest acute cholecystitis at this time.  2. However, there is a dilated common bile duct. Although there is  no frank intrahepatic biliary ductal dilatation at this time, the  possibility of a ductal stone in the common bile duct with early  obstruction is not excluded, and clinical correlation is  recommended.      Electronically Signed    By: Vinnie Langton M.D.    On: 10/15/2013 11:44         Verified By: Etheleen Mayhew, M.D.,  LabUnknown:    18-Apr-15 10:46, Abdomen 3 Way Includes PA Chest  PACS Image    18-Apr-15 11:31, US Abdomen Limited Survey  PACS Image    Assessment/Admission Diagnosis Gallstone (sludge) pancreatitis with obstructive jaundice   Plan Admit, IVF, Foley for UO monitoring Analgesia, anti-emetics, H2 blockers Will need lap ccy prior to D/C   Electronic  Signatures: Consuela Mimes (MD)  (Signed 18-Apr-15 12:28)  Authored: CHIEF COMPLAINT and HISTORY, PAST MEDICAL/SURGIAL HISTORY, ALLERGIES, FAMILY AND SOCIAL HISTORY, REVIEW OF SYSTEMS, PHYSICAL EXAM, LABS, Radiology, ASSESSMENT AND PLAN   Last Updated: 18-Apr-15 12:28 by Consuela Mimes (MD)

## 2014-10-21 NOTE — Op Note (Signed)
PATIENT NAME:  Andre Lowery, Andre Lowery MR#:  [id number] DATE OF BIRTH:  1976/02/18  DATE OF PROCEDURE:  10/19/2013  PREOPERATIVE DIAGNOSIS: Acute cholecystitis with biliary pancreatitis.   POSTOPERATIVE DIAGNOSIS:  Acute cholecystitis with biliary pancreatitis.   PROCEDURE: Laparoscopic cholecystectomy with attempted cholangiography.   SURGEON: Anjelica Gorniak E. Excell Seltzer, M.D.   ASSISTANT: Alpha Gula, PA-S.   ANESTHESIA: General with endotracheal tube.   INDICATIONS: This is a patient with a history of biliary pancreatitis who has had an ERCP with ductal stone extraction by Dr. Servando Snare. He is here for cholecystectomy and cholangiography.   Preoperatively, we discussed rationale for surgery, the timing of surgery, the risks of bleeding, infection, recurrence, failure to resolve his symptoms, conversion to an open procedure, retained common bile duct stone, any of which could require further surgery and/or ERCP, stent and papillotomy. This was all reviewed for him. He understood and agreed to proceed.   FINDINGS: Acute cholecystitis with greatly edematous gallbladder, redundant and enlarged gallbladder. Due to the edema and angulation of the cystic duct, cholangiography could not be performed after multiple attempts; see below.   DESCRIPTION OF PROCEDURE: The patient was induced with general anesthesia. He was on IV antibiotics, but was redosed intraoperatively with Zosyn. VTE prophylaxis was in place. He was prepped and draped in a sterile fashion. Marcaine was infiltrated in skin and subcutaneous tissues around the periumbilical area. Incision was made. Veress needle was placed. Pneumoperitoneum was obtained. A 5 mm trocar port was placed. The abdominal cavity was explored, and under direct vision, a 10 mm epigastric port and 2 lateral 5 mm ports were placed. The gallbladder was identified and placed on tension. It was very enlarged and elongated requiring changing to a 30 degree angled scope from the 0  degree scope in order to see the infundibular area. The peritoneum over the infundibulum was incised bluntly. Traction and further dissection allowed for elevation of the infundibular area for further dissection. The cystic duct and gallbladder junction was well identified. Multiple small vessels were doubly clipped and divided. This was a very acutely inflamed and somewhat hemorrhagic gallbladder. The cystic duct was clipped and incised and through a separate incision, an Angiocath cholangiogram catheter was attempted to be placed into the cystic duct. Multiple attempts through different angles were performed, and it could not be accessed; therefore, with the knowledge that he had had a successful preoperative stone extraction with cholangiography, the attempt was abandoned.   The cystic duct was then doubly clipped and divided. More small vessels were doubly clipped and divided as well, and the gallbladder was taken from or gallbladder fossa with electrocautery. It was placed in an Endo Catch bag, and because of the huge size of this gallbladder, the epigastric port site required considerable enlargement in order to get the gallbladder out of the abdomen. The port was replaced. The area was irrigated with copious amounts of normal saline. There was no sign of bile leak or bleeding. Through the right lateral port site was placed a 10 mm JP drain, placed in the foramen of Winslow and held in with 3-0 nylon. Further irrigation was performed. There was no sign of bleeding. Therefore, pneumoperitoneum was released. All ports were removed. Fascial edges were approximated with multiple figure-of-eight 0 Vicryls. The skin edges were approximated with skin staples. Sterile dressings were placed and the drain was placed to bulb suction.   The patient tolerated this procedure well. There were no complications. He was taken to the recovery room in stable condition  to be admitted for continued care.      ____________________________ Adah Salvageichard E. Excell Seltzerooper, MD rec:dmm D: 10/19/2013 12:28:31 ET T: 10/19/2013 12:49:24 ET JOB#: [id number]  cc: Adah Salvageichard E. Excell Seltzerooper, MD, <Dictator> Lattie HawICHARD E Kartik Fernando MD ELECTRONICALLY SIGNED 10/19/2013 16:32

## 2014-10-21 NOTE — Discharge Summary (Signed)
PATIENT NAME:  Andre Lowery, Andre Lowery MR#:  [id number] DATE OF BIRTH:  1975/09/21  DATE OF ADMISSION:  10/15/2013 DATE OF DISCHARGE:  10/24/2013  DISCHARGE DIAGNOSES: Biliary pancreatitis, cholecystitis, choledocholithiasis.   PROCEDURES: Laparoscopic cholecystectomy with attempted cholangiography, preop ERCP.   CONSULTANTS: Dr. Servando SnareWohl.   HISTORY OF PRESENT ILLNESS AND HOSPITAL COURSE: This is a patient who was admitted to the hospital with biliary pancreatitis. Preoperative ERCP was performed. He was taken to the operating room where laparoscopic cholecystectomy was performed, but cholangiography could not be obtained during the operation due to difficulty in cannulation of the cystic duct. Postoperatively, he did well, was eating a regular diet but was continuing to have elevated lipase levels in the 450 to 550 range, but was doing well and tolerating a regular diet. Therefore, he was discharged in stable condition with his drain still in place to be removed in the office the following week. He was discharged on oral analgesics with instructions to shower and follow up in our office in 10 days.  ____________________________ Adah Salvageichard E. Excell Seltzerooper, MD rec:sb D: 10/31/2013 19:50:41 ET T: 11/01/2013 07:33:49 ET JOB#: [id number]  cc: Adah Salvageichard E. Excell Seltzerooper, MD, <Dictator> Lattie HawICHARD E COOPER MD ELECTRONICALLY SIGNED 11/01/2013 19:17

## 2014-10-21 NOTE — Consult Note (Signed)
Chief Complaint:  Subjective/Chief Complaint Pt denies any abdominal pain.  c/o mild low back pain.  Denies nausea or vomiting.  Tolerating liquids well.  TMax 102.  Bili/LFTS improving.   VITAL SIGNS/ANCILLARY NOTES: **Vital Signs.:   21-Apr-15 06:27  Vital Signs Type Routine  Temperature Temperature (F) 100.4  Celsius 38  Pulse Pulse 112  Respirations Respirations 20  Systolic BP Systolic BP 446  Diastolic BP (mmHg) Diastolic BP (mmHg) 83  Mean BP 99  Pulse Ox % Pulse Ox % 92  Pulse Ox Activity Level  At rest  Oxygen Delivery Room Air/ 21 %   Brief Assessment:  GEN no acute distress, A/Ox3. Mother & sister at bedside.   Cardiac Regular  +mild tachycardia   Respiratory normal resp effort   Gastrointestinal details normal Soft  Nontender  Nondistended  Bowel sounds normal  No rebound tenderness  No gaurding   EXTR negative cyanosis/clubbing, negative edema   Additional Physical Exam Skin: pink, warm, dry HEENT: mild scleral icterus   Lab Results: Hepatic:  21-Apr-15 05:44   Bilirubin, Total  4.9  Alkaline Phosphatase  159 (45-117 NOTE: New Reference Range 05/20/13)  SGPT (ALT)  147  SGOT (AST)  48  Total Protein, Serum 6.9  Albumin, Serum  2.7  Routine Chem:  21-Apr-15 05:44   Glucose, Serum  100  Creatinine (comp) 1.05  Sodium, Serum  135  Potassium, Serum  3.4  Chloride, Serum 102  CO2, Serum 27  Calcium (Total), Serum  7.9  Osmolality (calc) 269  eGFR (African American) >60  eGFR (Non-African American) >60 (eGFR values <105m/min/1.73 m2 may be an indication of chronic kidney disease (CKD). Calculated eGFR is useful in patients with stable renal function. The eGFR calculation will not be reliable in acutely ill patients when serum creatinine is changing rapidly. It is not useful in  patients on dialysis. The eGFR calculation may not be applicable to patients at the low and high extremes of body sizes, pregnant women, and vegetarians.)  Anion Gap  6   Lipase  1087 (Result(s) reported on 18 Oct 2013 at 06:34AM.)  Routine Hem:  21-Apr-15 05:44   WBC (CBC)  14.4  RBC (CBC) 4.53  Hemoglobin (CBC)  12.8  Hematocrit (CBC)  38.4  Platelet Count (CBC) 199  MCV 85  MCH 28.3  MCHC 33.4  RDW 14.3  Neutrophil % 85.9  Lymphocyte % 6.0  Monocyte % 7.8  Eosinophil % 0.0  Basophil % 0.3  Neutrophil #  12.4  Lymphocyte #  0.9  Monocyte #  1.1  Eosinophil # 0.0  Basophil # 0.0 (Result(s) reported on 18 Oct 2013 at 06:29AM.)   Assessment/Plan:  Assessment/Plan:  Assessment Gallstone pancreatitis:  Improving s/p ERCP with removal of sludge yesterday.  He has been febrile, without abdominal pain, just back pain.  On IV Zosyn.   Plan 1) Continue IV zosyn 2) Continue supportive measures 3) cholecystectomy once pancreas cools off per surgery Please call if you have any questions or concerns   Electronic Signatures: JAndria Meuse(NP)  (Signed 21-Apr-15 12:26)  Authored: Chief Complaint, VITAL SIGNS/ANCILLARY NOTES, Brief Assessment, Lab Results, Assessment/Plan   Last Updated: 21-Apr-15 12:26 by JAndria Meuse(NP)

## 2014-10-21 NOTE — Consult Note (Signed)
PATIENT NAME:  Andre Lowery, Andre Lowery MR#:  [id number] DATE OF BIRTH:  [birth year]  DATE OF CONSULTATION:  10/17/2013  REFERRING PHYSICIAN:  Dr. Burt Knack CONSULTING PHYSICIAN: Jiles Garter, MD / Andria Meuse, NP  PRIMARY CARE PHYSICIAN: Not available.   REASON FOR CONSULTATION: Gallstone pancreatitis.   HISTORY OF PRESENT ILLNESS: Mr. Andre Lowery is a pleasant 39 year old Hispanic male. He was in his usual state of health until he developed severe abdominal pain 6 days ago while working. He said that the pain was moderate at that time, mostly in his upper abdomen, although he did radiate to his entire abdomen. It lasted for several days and then it became quite severe 2 mornings ago after eating. He had taken 4 over-the-counter colon cleanse and that seemed to make the pain worse. The pain was 10 out of 10 at worst. He denies any jaundice, but he did notice his urine was somewhat dark. He denies any clay-colored stools. His last bowel movement was yesterday. He had some nausea but denies any vomiting. His total bilirubin was 5.3, up to 7.8 today. His white blood cell count was 12.7, up to 14 today. His alkaline phosphatase is 167, AST 75, and ALT 207. He had a similar episode of pain about 5 months ago that resolved on its own. His pain currently is 1 out of 10. His lipase is greater than 10,000. He had abdominal films which showed a possible ileus. He had an ultrasound which showed a common bile duct of 13 mm at the porta hepatis and cholelithiasis.   PAST MEDICAL AND SURGICAL HISTORY: He denies any.   MEDICATIONS PRIOR TO ADMISSION: He denies any.   ALLERGIES: No known drug allergies.   FAMILY HISTORY: There is no known family history of colorectal carcinoma, liver or chronic GI problems or pancreatic problems.   SOCIAL HISTORY: His father is deceased due to war. His mother is living. He has lived with the same woman for 12 years. He has 2 healthy children. 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 He denies any tobacco. He rarely consumes alcohol, a couple of times a year. He denies any illicit drug use.   REVIEW OF SYSTEMS: See HPI, otherwise, negative review of systems.   PHYSICAL EXAMINATION: VITAL SIGNS: Temperature 99.4, pulse 108, blood pressure 144/86, O2 sat 94% on room air.  GENERAL: He is an obese Hispanic male who is alert, oriented, pleasant and cooperative, in no acute distress. His mother and friend are at bedside.  HEENT: Sclerae clear, anicteric. Conjunctivae pink. Oropharynx pink and moist without any lesions.  NECK: Supple without mass or thyromegaly.  CHEST: Heart regular rate and rhythm. Normal S1, S2. No murmurs, clicks, rubs or gallops.  LUNGS: Clear to auscultation bilaterally.  ABDOMEN: Protuberant. Positive bowel sounds x4. No bruits auscultated. He does have significant left upper quadrant tenderness on palpation. No rebound tenderness or guarding. No hepatosplenomegaly or mass, although exam is limited given the patient's body habitus.  EXTREMITIES: With trace pretibial and pedal edema bilaterally.   LABORATORY STUDIES: Calcium 7.9, otherwise normal met-7. INR 0.9. Troponin was negative. Amylase 14. Albumin 2.9.   IMPRESSION: Mr. Andre Lowery is a pleasant 39 year old Hispanic male with gallstone pancreatitis. His common bile duct is 13 mm on ultrasound with cholelithiasis. ERCP with stone extraction and sphincterotomy with Dr. Allen Norris. I have discussed the risks and benefits of the procedure which include but are not limited to worsening pancreatitis, bleeding, infection, perforation, or drug reaction. The patient agrees  with this plan and consent will be obtained. The patient did have ice chips early this morning and nothing else.   PLAN: 1.  N.p.o.  2.  Zosyn 3.375 mg q. 6 hours.  3.  ERCP today.   We would like to thank you for allowing [place] to participate in the care of this patient.  This services provided by Andria Meuse, NP under  collaborative agreement with Dr. Lucilla Lame.   ____________________________ Andria Meuse, NP klj:sb D: 10/17/2013 12:51:26 ET T: 10/17/2013 13:03:41 ET JOB#: [id number]  cc: Andria Meuse, NP, <Dictator> Andria Meuse FNP ELECTRONICALLY SIGNED 10/18/2013 14:30

## 2014-10-21 NOTE — Consult Note (Signed)
Chief Complaint:  Subjective/Chief Complaint Pt abdominal pain 1/10.  Denies nausea or vomiting.   TMax 100.2.   VITAL SIGNS/ANCILLARY NOTES: **Vital Signs.:   22-Apr-15 15:46  Vital Signs Type Post-Op  Temperature Temperature (F) 99  Celsius 37.2  Temperature Source oral  Pulse Pulse 99  Respirations Respirations 18  Systolic BP Systolic BP 161  Diastolic BP (mmHg) Diastolic BP (mmHg) 69  Mean BP 80  Pulse Ox % Pulse Ox % 91  Pulse Ox Activity Level  At rest  Oxygen Delivery Room Air/ 21 %   Brief Assessment:  GEN no acute distress, A/Ox3.  Wife & daughter at bedside.   Cardiac Regular   Respiratory normal resp effort   Gastrointestinal details normal Soft  Nontender  Bowel sounds normal  +mild distention, +dsgs intact, scant serous drainage JP   EXTR negative cyanosis/clubbing, negative edema   Additional Physical Exam Skin: pink, warm, dry HEENT:sclera clear   Lab Results:  Hepatic:  22-Apr-15 05:11   Bilirubin, Total  2.7  Alkaline Phosphatase  134 (45-117 NOTE: New Reference Range 05/20/13)  SGPT (ALT)  109  SGOT (AST) 33  Total Protein, Serum 7.1  Albumin, Serum  2.6  Routine Chem:  22-Apr-15 05:11   Potassium, Serum  2.8  Glucose, Serum 88  BUN 8  Creatinine (comp) 0.90  Sodium, Serum 136  Chloride, Serum 99  CO2, Serum 30  Calcium (Total), Serum  8.1  Osmolality (calc) 270  eGFR (African American) >60  eGFR (Non-African American) >60 (eGFR values <24m/min/1.73 m2 may be an indication of chronic kidney disease (CKD). Calculated eGFR is useful in patients with stable renal function. The eGFR calculation will not be reliable in acutely ill patients when serum creatinine is changing rapidly. It is not useful in  patients on dialysis. The eGFR calculation may not be applicable to patients at the low and high extremes of body sizes, pregnant women, and vegetarians.)  Anion Gap 7  Lipase  414 (Result(s) reported on 19 Oct 2013 at 06:39AM.)   09:56   Potassium, Serum  3.0 (Result(s) reported on 19 Oct 2013 at 10:30AM.)  Routine Hem:  22-Apr-15 05:11   WBC (CBC)  12.4  RBC (CBC)  4.15  Hemoglobin (CBC)  11.8  Hematocrit (CBC)  35.4  Platelet Count (CBC) 227  MCV 85  MCH 28.4  MCHC 33.3  RDW 13.8  Neutrophil % 79.7  Lymphocyte % 9.7  Monocyte % 9.3  Eosinophil % 0.9  Basophil % 0.4  Neutrophil #  9.9  Lymphocyte # 1.2  Monocyte #  1.2  Eosinophil # 0.1  Basophil # 0.1 (Result(s) reported on 19 Oct 2013 at 06:26AM.)   Assessment/Plan:  Assessment/Plan:  Assessment Gallstone pancreatitis:  Improving s/p ERCP with removal of sludge & cholecystectomy.  He is doing well other than mild nocturnal fever & is on antibiotics.   Plan Continue supportive measures Potassium repletion per attending Will sign off, please call if you have any questions or concerns   Electronic Signatures: JAndria Meuse(NP)  (Signed 22-Apr-15 18:29)  Authored: Chief Complaint, VITAL SIGNS/ANCILLARY NOTES, Brief Assessment, Lab Results, Assessment/Plan   Last Updated: 22-Apr-15 18:29 by JAndria Meuse(NP)

## 2017-12-29 ENCOUNTER — Ambulatory Visit (INDEPENDENT_AMBULATORY_CARE_PROVIDER_SITE_OTHER): Payer: 59 | Admitting: Podiatry

## 2017-12-29 ENCOUNTER — Encounter: Payer: Self-pay | Admitting: Podiatry

## 2017-12-29 VITALS — BP 147/87 | HR 78

## 2017-12-29 DIAGNOSIS — L600 Ingrowing nail: Secondary | ICD-10-CM | POA: Diagnosis not present

## 2017-12-29 NOTE — Patient Instructions (Signed)

## 2018-01-04 NOTE — Progress Notes (Signed)
   Subjective: Patient presents today for evaluation of soreness to the medial border of the left hallux that began 2-3 months ago. Patient is concerned for possible ingrown nail. Applying pressure to the toe as well as walking in a shoe increases the pain. He has not done anything to treat the symptoms. Patient presents today for further treatment and evaluation.  History reviewed. No pertinent past medical history.  Objective:  General: Well developed, nourished, in no acute distress, alert and oriented x3   Dermatology: Skin is warm, dry and supple bilateral. Medial border of the left hallux appears to be erythematous with evidence of an ingrowing nail. Pain on palpation noted to the border of the nail fold. The remaining nails appear unremarkable at this time. There are no open sores, lesions.  Vascular: Dorsalis Pedis artery and Posterior Tibial artery pedal pulses palpable. No lower extremity edema noted.   Neruologic: Grossly intact via light touch bilateral.  Musculoskeletal: Muscular strength within normal limits in all groups bilateral. Normal range of motion noted to all pedal and ankle joints.   Assesement: #1 Paronychia with ingrowing nail medial border left hallux  #2 Pain in toe #3 Incurvated nail  Plan of Care:  1. Patient evaluated.  2. Discussed treatment alternatives and plan of care. Explained nail avulsion procedure and post procedure course to patient. 3. Patient opted for permanent partial nail avulsion.  4. Prior to procedure, local anesthesia infiltration utilized using 3 ml of a 50:50 mixture of 2% plain lidocaine and 0.5% plain marcaine in a normal hallux block fashion and a betadine prep performed.  5. Partial permanent nail avulsion with chemical matrixectomy performed using 3x30sec applications of phenol followed by alcohol flush.  6. Light dressing applied. 7. Return to clinic in 2 weeks.   Felecia ShellingBrent M. Brenda Cowher, DPM [facility]  Dr. Felecia ShellingBrent M.  Lui Bellis, DPM    [address]                                        [place], [place] [address]                Office [phone]  Fax [phone]

## 2019-08-08 ENCOUNTER — Other Ambulatory Visit: Payer: Self-pay

## 2019-08-08 ENCOUNTER — Ambulatory Visit: Payer: Self-pay

## 2019-08-08 DIAGNOSIS — Z021 Encounter for pre-employment examination: Secondary | ICD-10-CM

## 2019-08-08 LAB — POCT URINE DRUG SCREEN
POC Amphetamine UR: NOT DETECTED
POC Cocaine UR: NOT DETECTED
POC Marijuana UR: NOT DETECTED
POC Methamphetamine UR: NOT DETECTED
POC Opiate Ur: NOT DETECTED
POC PHENCYCLIDINE UR: NOT DETECTED
URINE TEMPERATURE: 94 Degrees F (ref 90.0–100.0)

## 2019-08-08 NOTE — Progress Notes (Signed)
     Patient ID: Andre Lowery, male    DOB: May 18, 1976, 44 y.o.   MRN: [id number]    Thank you!!  Danise Edge RN  [facility]  Occupational Health Nurse Specialist [facility] Direct Dial: [phone]  Cell:  [phone] Website: [email or web address]

## 2021-12-23 LAB — LIPID PANEL
Cholesterol: 178 (ref 0–200)
HDL: 44 (ref 35–70)
LDL Cholesterol: 27
Triglycerides: 150 (ref 40–160)

## 2021-12-23 LAB — CBC AND DIFFERENTIAL
HCT: 45 (ref 41–53)
Hemoglobin: 14.6 (ref 13.5–17.5)
Neutrophils Absolute: 3.2
Platelets: 262 10*3/uL (ref 150–400)

## 2021-12-23 LAB — COMPREHENSIVE METABOLIC PANEL
Albumin: 4.5 (ref 3.5–5.0)
Calcium: 9.3 (ref 8.7–10.7)
Globulin: 2.9
eGFR: 102

## 2021-12-23 LAB — HEPATIC FUNCTION PANEL
ALT: 26 U/L (ref 10–40)
AST: 14 (ref 14–40)
Alkaline Phosphatase: 108 (ref 25–125)
Bilirubin, Total: 0.3

## 2021-12-23 LAB — BASIC METABOLIC PANEL
BUN: 11 (ref 4–21)
Chloride: 103 (ref 99–108)
Creatinine: 1 (ref <=1.3)
Sodium: 138 (ref 137–147)

## 2021-12-23 LAB — HEMOGLOBIN A1C: Hemoglobin A1C: 5.7

## 2021-12-23 LAB — CBC: RBC: 5.22 — AB (ref 3.87–5.11)

## 2022-01-09 ENCOUNTER — Encounter: Payer: Self-pay | Admitting: Family

## 2022-01-09 ENCOUNTER — Ambulatory Visit: Payer: BC Managed Care – PPO | Admitting: Family

## 2022-01-09 VITALS — BP 118/78 | HR 78 | Temp 98.6°F | Resp 16 | Ht 66.25 in | Wt 273.4 lb

## 2022-01-09 DIAGNOSIS — I8392 Asymptomatic varicose veins of left lower extremity: Secondary | ICD-10-CM | POA: Diagnosis not present

## 2022-01-09 DIAGNOSIS — Z23 Encounter for immunization: Secondary | ICD-10-CM

## 2022-01-09 DIAGNOSIS — R7303 Prediabetes: Secondary | ICD-10-CM

## 2022-01-09 DIAGNOSIS — E782 Mixed hyperlipidemia: Secondary | ICD-10-CM

## 2022-01-09 DIAGNOSIS — I83893 Varicose veins of bilateral lower extremities with other complications: Secondary | ICD-10-CM | POA: Insufficient documentation

## 2022-01-09 DIAGNOSIS — E66813 Obesity, class 3: Secondary | ICD-10-CM | POA: Insufficient documentation

## 2022-01-09 DIAGNOSIS — Z6841 Body Mass Index (BMI) 40.0 and over, adult: Secondary | ICD-10-CM

## 2022-01-09 DIAGNOSIS — H538 Other visual disturbances: Secondary | ICD-10-CM

## 2022-01-09 NOTE — Patient Instructions (Addendum)
A referral was placed today for vascular doctor.  Please let us know if you have not heard back within 2 weeks about the referral.  A referral was placed today for optometrist. Please let us know if you have not heard back within 2 weeks about the referral.  Come fasting to your next appointment  That means no food. Black coffee and water only.   Welcome to our clinic, I am happy to have you as my new patient. I am excited to continue on this healthcare journey with you.  Stop by the lab prior to leaving today. I will notify you of your results once received.   Please keep in mind Any my chart messages you send have up to a three business day turnaround for a response.  Phone calls may take up to a one full business day turnaround for a  response.   If you need a medication refill I recommend you request it through the pharmacy as this is easiest for [place] rather than sending a message and or phone call.   Due to recent changes in healthcare laws, you may see results of your imaging and/or laboratory studies on MyChart before I have had a chance to review them.  I understand that in some cases there may be results that are confusing or concerning to you. Please understand that not all results are received at the same time and often I may need to interpret multiple results in order to provide you with the best plan of care or course of treatment. Therefore, I ask that you please give me 2 business days to thoroughly review all your results before contacting my office for clarification. Should we see a critical lab result, you will be contacted sooner.   It was a pleasure seeing you today! Please do not hesitate to reach out with any questions and or concerns.  Regards,   Mort Sawyers FNP-C

## 2022-01-09 NOTE — Progress Notes (Signed)
New Patient Office Visit  Subjective:  Patient ID: Andre Lowery, male    DOB: October 19, 1975  Age: 46 y.o. MRN: [id number]  CC:  Chief Complaint  Patient presents with   Establish Care    HPI Andre Lowery is here to establish care as a new patient.  Prior provider was: over eight years ago, overdue.  Pt is without acute concerns.   He admits that sometimes if he is looking at white paper at some times and or trying to read small print he has trouble seeing.   chronic concerns:  HLD: recent LDL 107   Has tried to increase with exercise, walking a few days in the week. Does eat greasy foods, has tried to cut back. Trying not to eat before bedtime.   Notices that his veins have pumping up on his left lower extremitiy, not painful, but works standing up for 8 hours a day. No pain in the legs, not with walking either. But concerned because getting bigger.   History reviewed. No pertinent past medical history.  Past Surgical History:  Procedure Laterality Date   CHOLECYSTECTOMY     2015    Family History  Problem Relation Age of Onset   High Cholesterol Mother    Hypertension Mother     Social History   Socioeconomic History   Marital status: Single    Spouse name: Not on file   Number of children: 3   Years of education: Not on file   Highest education level: Not on file  Occupational History   Occupation: Pharmacist, hospital  Tobacco Use   Smoking status: Never   Smokeless tobacco: Never  Vaping Use   Vaping Use: Never used  Substance and Sexual Activity   Alcohol use: Not Currently    Comment: Stop 8 years ago   Drug use: Never   Sexual activity: Not Currently    Comment: single at current  Other Topics Concern   Not on file  Social History Narrative   89, 89 , two boys, and daughter 36.    Will have first grandchild , boy, in 9/23.    Social Determinants of Health   Financial Resource Strain: Not on file  Food Insecurity: Not on file   Transportation Needs: Not on file  Physical Activity: Not on file  Stress: Not on file  Social Connections: Not on file  Intimate Partner Violence: Not on file    No outpatient medications prior to visit.   No facility-administered medications prior to visit.    No Known Allergies      Objective:    Physical Exam Constitutional:      General: He is not in acute distress.    Appearance: Normal appearance. He is normal weight. He is not ill-appearing, toxic-appearing or diaphoretic.  HENT:     Head: Normocephalic.     Right Ear: Tympanic membrane normal.     Left Ear: Tympanic membrane normal.     Nose: Nose normal.  Eyes:     Pupils: Pupils are equal, round, and reactive to light.  Cardiovascular:     Rate and Rhythm: Normal rate and regular rhythm.     Comments: Varicosities non thrombosed non-tender on left lower extremity Pulmonary:     Effort: Pulmonary effort is normal.     Breath sounds: Normal breath sounds.  Abdominal:     General: Abdomen is flat. Bowel sounds are normal.     Palpations: Abdomen is soft.  Tenderness: There is no abdominal tenderness.  Musculoskeletal:        General: Normal range of motion.     Cervical back: Normal range of motion.  Skin:    General: Skin is warm.  Neurological:     General: No focal deficit present.     Mental Status: He is alert and oriented to person, place, and time.     Motor: No weakness.     Gait: Gait normal.  Psychiatric:        Mood and Affect: Mood normal.        Behavior: Behavior normal.        Thought Content: Thought content normal.        Judgment: Judgment normal.       BP 118/78   Pulse 78   Temp 98.6 F (37 C)   Resp 16   Ht 5' 6.25" (1.683 m)   Wt 273 lb 6 oz (124 kg)   SpO2 96%   BMI 43.79 kg/m  Wt Readings from Last 3 Encounters:  01/09/22 273 lb 6 oz (124 kg)     Health Maintenance Due  Topic Date Due   HIV Screening  Never done   Hepatitis C Screening  Never done     There are no preventive care reminders to display for this patient.  No results found for: "TSH" Lab Results  Component Value Date   WBC 3.7 (L) 11/08/2013   HGB 13.2 11/08/2013   HCT 41.0 11/08/2013   MCV 86 11/08/2013   PLT 343 11/08/2013   Lab Results  Component Value Date   NA 137 11/01/2013   K 4.3 11/01/2013   CO2 27 11/01/2013   GLUCOSE 109 (H) 11/01/2013   BUN 7 11/01/2013   CREATININE 1.07 11/01/2013   BILITOT 0.8 11/01/2013   ALKPHOS 110 11/01/2013   AST 29 11/01/2013   ALT 54 11/01/2013   PROT 8.7 (H) 11/01/2013   ALBUMIN 3.7 11/01/2013   CALCIUM 10.0 11/01/2013   ANIONGAP 7 11/01/2013   No results found for: "CHOL" No results found for: "HDL" No results found for: "LDLCALC" No results found for: "TRIG" No results found for: "CHOLHDL" No results found for: "HGBA1C"    Assessment & Plan:   Problem List Items Addressed This Visit       Cardiovascular and Mediastinum   Asymptomatic varicose veins of left lower extremity    Referred to vascular as pt interested if no longer having varicose veins      Relevant Orders   Ambulatory referral to Vascular Surgery     Other   Prediabetes    Work on diabetic diet and exercise as tolerated      Mixed hyperlipidemia - Primary    work on low chol diet and exercise as tolerated      Class 3 severe obesity due to excess calories without serious comorbidity with body mass index (BMI) of 40.0 to 44.9 in adult [facility])    work on diet and exercise       Encounter for vaccination    TDAP  vaccine administered in office Pt tolerated procedure well  Verbal consent obtained prior to administration  Handout given in regards to vaccination.        Relevant Orders   Tdap vaccine greater than or equal to 7yo IM (Completed)   Blurry vision, bilateral    Suspected need for eye exam, possible need for readers Referred to optometry      Relevant  Orders   Ambulatory referral to Optometry    No orders of  the defined types were placed in this encounter.   Follow-up: Return in about 4 months (around 05/12/2022) for regular follow up .    Mort Sawyers, FNP

## 2022-01-10 DIAGNOSIS — Z23 Encounter for immunization: Secondary | ICD-10-CM | POA: Insufficient documentation

## 2022-01-10 DIAGNOSIS — H538 Other visual disturbances: Secondary | ICD-10-CM | POA: Insufficient documentation

## 2022-01-10 NOTE — Assessment & Plan Note (Signed)
TDAP  vaccine administered in office Pt tolerated procedure well  Verbal consent obtained prior to administration  Handout given in regards to vaccination.

## 2022-01-10 NOTE — Assessment & Plan Note (Signed)
Suspected need for eye exam, possible need for readers Referred to optometry

## 2022-01-10 NOTE — Assessment & Plan Note (Signed)
work on [employer] and exercise as tolerated

## 2022-01-10 NOTE — Assessment & Plan Note (Signed)
Work on diabetic diet and exercise as tolerated 

## 2022-01-10 NOTE — Assessment & Plan Note (Signed)
work on diet and exercise

## 2022-01-10 NOTE — Assessment & Plan Note (Signed)
Referred to vascular as pt interested if no longer having varicose veins

## 2022-05-15 ENCOUNTER — Ambulatory Visit: Payer: BC Managed Care – PPO | Admitting: Family

## 2022-05-26 ENCOUNTER — Ambulatory Visit: Payer: BC Managed Care – PPO | Admitting: Family

## 2022-07-24 ENCOUNTER — Encounter: Payer: Self-pay | Admitting: Family

## 2022-07-24 ENCOUNTER — Ambulatory Visit: Payer: BC Managed Care – PPO | Admitting: Family

## 2022-07-24 VITALS — BP 130/78 | HR 78 | Temp 98.3°F | Ht 66.25 in | Wt 273.8 lb

## 2022-07-24 DIAGNOSIS — H538 Other visual disturbances: Secondary | ICD-10-CM

## 2022-07-24 DIAGNOSIS — E782 Mixed hyperlipidemia: Secondary | ICD-10-CM | POA: Diagnosis not present

## 2022-07-24 DIAGNOSIS — R7303 Prediabetes: Secondary | ICD-10-CM | POA: Diagnosis not present

## 2022-07-24 DIAGNOSIS — I8392 Asymptomatic varicose veins of left lower extremity: Secondary | ICD-10-CM | POA: Diagnosis not present

## 2022-07-24 DIAGNOSIS — Z6841 Body Mass Index (BMI) 40.0 and over, adult: Secondary | ICD-10-CM

## 2022-07-24 LAB — POCT GLYCOSYLATED HEMOGLOBIN (HGB A1C): Hemoglobin A1C: 5.7 % — AB (ref 4.0–5.6)

## 2022-07-24 NOTE — Assessment & Plan Note (Signed)
Work on low cholesterol diet and exercise as tolerated  

## 2022-07-24 NOTE — Assessment & Plan Note (Signed)
Put in another referral for optometry pt states he will call to make appt for eye exam.

## 2022-07-24 NOTE — Patient Instructions (Addendum)
(  [phone] Call this number for vascular to see about your varicose veins. You have a referral in place with nurse practitioner Eulogio Ditch.    Regards,   Eugenia Pancoast FNP-C

## 2022-07-24 NOTE — Progress Notes (Signed)
   Established Patient Office Visit  Subjective:      CC:  Chief Complaint  Patient presents with   Hyperlipidemia    HPI: Andre Lowery is a 47 y.o. male presenting on 07/24/2022 for Hyperlipidemia . Varicose veins: did not see vascular, he states has been too busy.   Blurry vision, worse in am. When he is tired or looking at a screen too much he has more issues with blurriness. Has not yet seen eye doctor.   Prediabetes: poor diet, hard to exercise in the winter and busy with work. Hard to stay away from starchy food.      Social history:  Relevant past medical, surgical, family and social history reviewed and updated as indicated. Interim medical history since our last visit reviewed.  Allergies and medications reviewed and updated.  DATA REVIEWED: CHART IN EPIC     ROS: Negative unless specifically indicated above in HPI.   No current outpatient medications on file.      Objective:    BP 130/78   Pulse 78   Temp 98.3 F (36.8 C) (Oral)   Ht 5' 6.25" (1.683 m)   Wt 273 lb 12.8 oz (124.2 kg)   SpO2 99%   BMI 43.86 kg/m   Wt Readings from Last 3 Encounters:  07/24/22 273 lb 12.8 oz (124.2 kg)  01/09/22 273 lb 6 oz (124 kg)    Physical Exam  Physical Exam Constitutional:      General: not in acute distress.    Appearance: Normal appearance. normal weight. is not ill-appearing, toxic-appearing or diaphoretic.  Cardiovascular:     Rate and Rhythm: Normal rate.  Pulmonary:     Effort: Pulmonary effort is normal.  Musculoskeletal:        General: Normal range of motion.  Neurological:     General: No focal deficit present.     Mental Status: alert and oriented to person, place, and time. Mental status is at baseline.  Psychiatric:        Mood and Affect: Mood normal.        Behavior: Behavior normal.        Thought Content: Thought content normal.        Judgment: Judgment normal.        Assessment & Plan:  Blurry vision,  bilateral Assessment & Plan: Put in another referral for optometry pt states he will call to make appt for eye exam.   Orders: -     Ambulatory referral to Optometry  Asymptomatic varicose veins of left lower extremity Assessment & Plan: Advised pt to call vascular surgeon to f/u with this.    Class 3 severe obesity due to excess calories without serious comorbidity with body mass index (BMI) of 40.0 to 44.9 in adult [facility]) Assessment & Plan: Pt advised to work on diet and exercise as tolerated    Mixed hyperlipidemia Assessment & Plan: Work on low cholesterol diet and exercise as tolerated    Prediabetes Assessment & Plan: Poct A1c stable.  Pt advised of the following: Work on a diabetic diet, try to incorporate exercise at least 20-30 a day for 3 days a week or more.    Orders: -     POCT glycosylated hemoglobin (Hb A1C)     Return in about 6 months (around 01/22/2023) for f/u CPE.  Eugenia Pancoast, MSN, APRN, FNP-C [place]

## 2022-07-24 NOTE — Assessment & Plan Note (Signed)
Pt advised to work on diet and exercise as tolerated  

## 2022-07-24 NOTE — Assessment & Plan Note (Signed)
Advised pt to call vascular surgeon to f/u with this.

## 2022-07-24 NOTE — Assessment & Plan Note (Addendum)
Poct A1c stable.  Pt advised of the following: Work on a diabetic diet, try to incorporate exercise at least 20-30 a day for 3 days a week or more.

## 2022-07-25 ENCOUNTER — Ambulatory Visit: Payer: BC Managed Care – PPO | Admitting: Family

## 2022-08-12 ENCOUNTER — Ambulatory Visit (INDEPENDENT_AMBULATORY_CARE_PROVIDER_SITE_OTHER): Payer: BC Managed Care – PPO | Admitting: Nurse Practitioner

## 2022-08-12 ENCOUNTER — Encounter (INDEPENDENT_AMBULATORY_CARE_PROVIDER_SITE_OTHER): Payer: 59 | Admitting: Nurse Practitioner

## 2022-08-12 ENCOUNTER — Encounter (INDEPENDENT_AMBULATORY_CARE_PROVIDER_SITE_OTHER): Payer: Self-pay | Admitting: Nurse Practitioner

## 2022-08-12 VITALS — BP 150/86 | HR 88 | Resp 16 | Wt 274.6 lb

## 2022-08-12 DIAGNOSIS — I8311 Varicose veins of right lower extremity with inflammation: Secondary | ICD-10-CM

## 2022-08-12 DIAGNOSIS — E782 Mixed hyperlipidemia: Secondary | ICD-10-CM

## 2022-08-12 DIAGNOSIS — I8312 Varicose veins of left lower extremity with inflammation: Secondary | ICD-10-CM

## 2022-08-12 NOTE — Progress Notes (Signed)
Subjective:    Patient ID: Andre Lowery, male    DOB: 10-31-1975, 47 y.o.   MRN: [id number] Chief Complaint  Patient presents with   New Patient (Initial Visit)    Ref Dugal consult varicose veins    The patient is seen for evaluation of symptomatic varicose veins. The patient relates burning and stinging which worsened steadily throughout the course of the day, particularly with standing. The patient also notes an aching and throbbing pain over the varicosities, particularly with prolonged dependent positions. The symptoms are significantly improved with elevation.  The patient also notes that during hot weather the symptoms are greatly intensified. The patient states the pain from the varicose veins interferes with work, daily exercise, shopping and household maintenance. At this point, the symptoms are persistent and severe enough that they're having a negative impact on lifestyle and are interfering with daily activities.  There is no history of DVT, PE or superficial thrombophlebitis. There is no history of ulceration or hemorrhage. The patient denies a significant family history of varicose veins.  The patient has worn graduated compression in the past. At the present time the patient has not been using over-the-counter analgesics. There is no history of prior surgical intervention or sclerotherapy.      Review of Systems  Cardiovascular:  Positive for leg swelling.  All other systems reviewed and are negative.      Objective:   Physical Exam Vitals reviewed.  HENT:     Head: Normocephalic.  Cardiovascular:     Rate and Rhythm: Normal rate.     Pulses: Normal pulses.  Pulmonary:     Effort: Pulmonary effort is normal.  Skin:    General: Skin is warm and dry.     Comments: Large varicosities in the left lower extremity  Neurological:     Mental Status: He is alert and oriented to person, place, and time.  Psychiatric:        Mood and Affect: Mood normal.         Behavior: Behavior normal.        Thought Content: Thought content normal.        Judgment: Judgment normal.     BP (!) 150/86 (BP Location: Left Arm)   Pulse 88   Resp 16   Wt 274 lb 9.6 oz (124.6 kg)   BMI 43.99 kg/m   History reviewed. No pertinent past medical history.  Social History   Socioeconomic History   Marital status: Single    Spouse name: Not on file   Number of children: 3   Years of education: Not on file   Highest education level: Not on file  Occupational History   Occupation: Programme researcher, broadcasting/film/video  Tobacco Use   Smoking status: Never   Smokeless tobacco: Never  Vaping Use   Vaping Use: Never used  Substance and Sexual Activity   Alcohol use: Not Currently    Comment: Stop 8 years ago   Drug use: Never   Sexual activity: Not Currently    Comment: single at current  Other Topics Concern   Not on file  Social History Narrative   67, 43 , two boys, and daughter 63.    Will have first grandchild , boy, in 9/23.    Social Determinants of Health   Financial Resource Strain: Not on file  Food Insecurity: Not on file  Transportation Needs: Not on file  Physical Activity: Not on file  Stress: Not on file  Social Connections:  Not on file  Intimate Partner Violence: Not on file    Past Surgical History:  Procedure Laterality Date   CHOLECYSTECTOMY     2015    Family History  Problem Relation Age of Onset   High Cholesterol Mother    Hypertension Mother     No Known Allergies     Latest Ref Rng & Units 12/23/2021   12:00 AM 11/08/2013    8:27 AM 11/01/2013    8:32 AM  CBC  WBC 3.8 - 10.6 x10 3/mm 3  3.7  6.2   Hemoglobin 13.5 - 17.5 14.6     13.2  13.0   Hematocrit 41 - 53 45     41.0  40.0   Platelets 150 - 400 K/uL 262     343  639      This result is from an external source.      CMP     Component Value Date/Time   NA 138 12/23/2021 0000   NA 137 11/01/2013 0832   K 4.3 11/01/2013 0832   CL 103 12/23/2021 0000   CL 103  11/01/2013 0832   CO2 27 11/01/2013 0832   GLUCOSE 109 (H) 11/01/2013 0832   BUN 11 12/23/2021 0000   BUN 7 11/01/2013 0832   CREATININE 1.0 12/23/2021 0000   CREATININE 1.07 11/01/2013 0832   CALCIUM 9.3 12/23/2021 0000   CALCIUM 10.0 11/01/2013 0832   PROT 8.7 (H) 11/01/2013 0832   ALBUMIN 4.5 12/23/2021 0000   ALBUMIN 3.7 11/01/2013 0832   AST 14 12/23/2021 0000   AST 29 11/01/2013 0832   ALT 26 12/23/2021 0000   ALT 54 11/01/2013 0832   ALKPHOS 108 12/23/2021 0000   ALKPHOS 110 11/01/2013 0832   BILITOT 0.8 11/01/2013 0832   GFRNONAA >60 11/01/2013 0832   GFRAA >60 11/01/2013 0832     No results found.     Assessment & Plan:   1. Varicose veins of both lower extremities with inflammation  Recommend:  The patient has large symptomatic varicose veins that are painful and associated with swelling.  I have had a long discussion with the patient regarding  varicose veins and why they cause symptoms.  Patient will continue wearing graduated compression stockings class 1 on a daily basis, beginning first thing in the morning and removing them in the evening. The patient is instructed specifically not to sleep in the stockings.    The patient  will also continue using over-the-counter analgesics such as Motrin 600 mg po TID to help control the symptoms.    In addition, behavioral modification including elevation during the day will be initiated.     An ultrasound of the venous system will be obtained.   Further plans will be based on the ultrasound results and whether conservative therapies are successful at eliminating the pain and swelling.   2. Mixed hyperlipidemia Continue statin as ordered and reviewed, no changes at this time   No current outpatient medications on file prior to visit.   No current facility-administered medications on file prior to visit.    There are no Patient Instructions on file for this visit. No follow-ups on file.   Kris Hartmann,  NP

## 2022-08-25 ENCOUNTER — Other Ambulatory Visit (INDEPENDENT_AMBULATORY_CARE_PROVIDER_SITE_OTHER): Payer: Self-pay | Admitting: Nurse Practitioner

## 2022-08-25 DIAGNOSIS — I8392 Asymptomatic varicose veins of left lower extremity: Secondary | ICD-10-CM

## 2022-08-28 ENCOUNTER — Encounter (INDEPENDENT_AMBULATORY_CARE_PROVIDER_SITE_OTHER): Payer: Self-pay | Admitting: Nurse Practitioner

## 2022-08-28 ENCOUNTER — Ambulatory Visit (INDEPENDENT_AMBULATORY_CARE_PROVIDER_SITE_OTHER): Payer: BC Managed Care – PPO | Admitting: Nurse Practitioner

## 2022-08-28 ENCOUNTER — Ambulatory Visit (INDEPENDENT_AMBULATORY_CARE_PROVIDER_SITE_OTHER): Payer: BC Managed Care – PPO

## 2022-08-28 VITALS — BP 125/83 | HR 73 | Resp 16 | Wt 270.8 lb

## 2022-08-28 DIAGNOSIS — I83812 Varicose veins of left lower extremities with pain: Secondary | ICD-10-CM

## 2022-08-28 DIAGNOSIS — E782 Mixed hyperlipidemia: Secondary | ICD-10-CM

## 2022-08-28 DIAGNOSIS — I8392 Asymptomatic varicose veins of left lower extremity: Secondary | ICD-10-CM | POA: Diagnosis not present

## 2022-09-16 ENCOUNTER — Encounter (INDEPENDENT_AMBULATORY_CARE_PROVIDER_SITE_OTHER): Payer: Self-pay | Admitting: Nurse Practitioner

## 2022-09-16 NOTE — Progress Notes (Signed)
Subjective:    Patient ID: Andre Lowery, male    DOB: Aug 12, 1975, 47 y.o.   MRN: [id number] Chief Complaint  Patient presents with   Follow-up    Ultrasound follow up    The patient returns for followup of painful varicose veins.  The patient continues to have pain in the lower extremities with dependency. The pain is lessened with elevation. Graduated compression stockings, Class I (20-30 mmHg), have been worn but the stockings do not eliminate the leg pain. Over-the-counter analgesics do not improve the symptoms. The degree of discomfort continues to interfere with daily activities. The patient notes the pain in the legs is causing problems with daily exercise, at the workplace and even with household activities and maintenance such as standing in the kitchen preparing meals and doing dishes.   Venous ultrasound shows normal deep venous system, no evidence of acute or chronic DVT.  Superficial reflux is present in the left great wrist vein as well as right small saphenous vein    Review of Systems  Cardiovascular:  Positive for leg swelling.  All other systems reviewed and are negative.      Objective:   Physical Exam Vitals reviewed.  HENT:     Head: Normocephalic.  Cardiovascular:     Rate and Rhythm: Normal rate.     Pulses: Normal pulses.  Pulmonary:     Effort: Pulmonary effort is normal.  Musculoskeletal:        General: Tenderness present.     Left lower leg: Edema present.  Skin:    General: Skin is warm and dry.  Neurological:     Mental Status: He is alert and oriented to person, place, and time.  Psychiatric:        Mood and Affect: Mood normal.        Behavior: Behavior normal.        Thought Content: Thought content normal.        Judgment: Judgment normal.     BP 125/83 (BP Location: Left Arm)   Pulse 73   Resp 16   Wt 270 lb 12.8 oz (122.8 kg)   BMI 43.38 kg/m   History reviewed. No pertinent past medical history.  Social History    Socioeconomic History   Marital status: Single    Spouse name: Not on file   Number of children: 3   Years of education: Not on file   Highest education level: Not on file  Occupational History   Occupation: Programme researcher, broadcasting/film/video  Tobacco Use   Smoking status: Never   Smokeless tobacco: Never  Vaping Use   Vaping Use: Never used  Substance and Sexual Activity   Alcohol use: Not Currently    Comment: Stop 8 years ago   Drug use: Never   Sexual activity: Not Currently    Comment: single at current  Other Topics Concern   Not on file  Social History Narrative   53, 82 , two boys, and daughter 56.    Will have first grandchild , boy, in 9/23.    Social Determinants of Health   Financial Resource Strain: Not on file  Food Insecurity: Not on file  Transportation Needs: Not on file  Physical Activity: Not on file  Stress: Not on file  Social Connections: Not on file  Intimate Partner Violence: Not on file    Past Surgical History:  Procedure Laterality Date   CHOLECYSTECTOMY     2015    Family History  Problem  Relation Age of Onset   High Cholesterol Mother    Hypertension Mother     No Known Allergies     Latest Ref Rng & Units 12/23/2021   12:00 AM 11/08/2013    8:27 AM 11/01/2013    8:32 AM  CBC  WBC 3.8 - 10.6 x10 3/mm 3  3.7  6.2   Hemoglobin 13.5 - 17.5 14.6     13.2  13.0   Hematocrit 41 - 53 45     41.0  40.0   Platelets 150 - 400 K/uL 262     343  639      This result is from an external source.      CMP     Component Value Date/Time   NA 138 12/23/2021 0000   NA 137 11/01/2013 0832   K 4.3 11/01/2013 0832   CL 103 12/23/2021 0000   CL 103 11/01/2013 0832   CO2 27 11/01/2013 0832   GLUCOSE 109 (H) 11/01/2013 0832   BUN 11 12/23/2021 0000   BUN 7 11/01/2013 0832   CREATININE 1.0 12/23/2021 0000   CREATININE 1.07 11/01/2013 0832   CALCIUM 9.3 12/23/2021 0000   CALCIUM 10.0 11/01/2013 0832   PROT 8.7 (H) 11/01/2013 0832   ALBUMIN 4.5 12/23/2021  0000   ALBUMIN 3.7 11/01/2013 0832   AST 14 12/23/2021 0000   AST 29 11/01/2013 0832   ALT 26 12/23/2021 0000   ALT 54 11/01/2013 0832   ALKPHOS 108 12/23/2021 0000   ALKPHOS 110 11/01/2013 0832   BILITOT 0.8 11/01/2013 0832   GFRNONAA >60 11/01/2013 0832   GFRAA >60 11/01/2013 0832     No results found.     Assessment & Plan:   1. Varicose veins of left lower extremity with pain Recommend  I have reviewed my previous  discussion with the patient regarding  varicose veins and why they cause symptoms. Patient will continue  wearing graduated compression stockings class 1 on a daily basis, beginning first thing in the morning and removing them in the evening.  The patient is CEAP [id number].  The patient has been wearing compression for more than 12 weeks with no or little benefit.  The patient has been exercising daily for more than 12 weeks. The patient has been elevating and taking OTC pain medications for more than 12 weeks.  None of these have have eliminated the pain related to the varicose veins and venous reflux or the discomfort regarding venous congestion.    In addition, behavioral modification including elevation during the day was again discussed and this will continue.  The patient has utilized over the counter pain medications and has been exercising.  However, at this time conservative therapy has not alleviated the patient's symptoms of leg pain and swelling  Recommend: laser ablation of the left great saphenous veins to eliminate the symptoms of pain and swelling of the lower extremities caused by the severe superficial venous reflux disease.  - VAS [place] LOWER EXTREMITY VENOUS REFLUX  2. Mixed hyperlipidemia Continue statin as ordered and reviewed, no changes at this time   No current outpatient medications on file prior to visit.   No current facility-administered medications on file prior to visit.    There are no Patient Instructions on file for this  visit. No follow-ups on file.   Kris Hartmann, NP

## 2023-01-11 ENCOUNTER — Encounter: Payer: Self-pay | Admitting: Emergency Medicine

## 2023-01-11 ENCOUNTER — Emergency Department
Admission: EM | Admit: 2023-01-11 | Discharge: 2023-01-11 | Disposition: A | Discharge status: Home / Self Care | Payer: BC Managed Care – PPO | Attending: Emergency Medicine | Admitting: Emergency Medicine

## 2023-01-11 ENCOUNTER — Other Ambulatory Visit: Payer: Self-pay

## 2023-01-11 DIAGNOSIS — S81851A Open bite, right lower leg, initial encounter: Secondary | ICD-10-CM | POA: Diagnosis not present

## 2023-01-11 DIAGNOSIS — Z203 Contact with and (suspected) exposure to rabies: Secondary | ICD-10-CM | POA: Diagnosis not present

## 2023-01-11 DIAGNOSIS — Z23 Encounter for immunization: Secondary | ICD-10-CM | POA: Diagnosis not present

## 2023-01-11 DIAGNOSIS — W5581XA Bitten by other mammals, initial encounter: Secondary | ICD-10-CM | POA: Insufficient documentation

## 2023-01-11 DIAGNOSIS — Z2914 Encounter for prophylactic rabies immune globin: Secondary | ICD-10-CM | POA: Insufficient documentation

## 2023-01-11 DIAGNOSIS — T148XXA Other injury of unspecified body region, initial encounter: Secondary | ICD-10-CM

## 2023-01-11 MED ORDER — RABIES IMMUNE GLOBULIN 150 UNIT/ML IM INJ
1500.0000 [IU] | INJECTION | Freq: Once | INTRAMUSCULAR | Status: AC
Start: 2023-01-11 — End: 2023-01-11
  Administered 2023-01-11: 1500 [IU]
  Filled 2023-01-11: qty 10

## 2023-01-11 MED ORDER — TETANUS-DIPHTH-ACELL PERTUSSIS 5-2.5-18.5 LF-MCG/0.5 IM SUSY
0.5000 mL | PREFILLED_SYRINGE | Freq: Once | INTRAMUSCULAR | Status: AC
  Administered 2023-01-11: 0.5 mL via INTRAMUSCULAR
  Filled 2023-01-11: qty 0.5

## 2023-01-11 MED ORDER — AMOXICILLIN-POT CLAVULANATE 875-125 MG PO TABS: 1.0000 | ORAL_TABLET | Freq: Two times a day (BID) | ORAL | 0 refills | Status: AC

## 2023-01-11 MED ORDER — RABIES IMMUNE GLOBULIN 150 UNIT/ML IM INJ: 20.0000 [IU]/kg | INJECTION | Freq: Once | INTRAMUSCULAR | Status: DC

## 2023-01-11 MED ORDER — RABIES IMMUNE GLOBULIN 150 UNIT/ML IM INJ
975.0000 [IU] | INJECTION | Freq: Once | INTRAMUSCULAR | Status: AC
Start: 2023-01-11 — End: 2023-01-11
  Administered 2023-01-11: 975 [IU]
  Filled 2023-01-11: qty 8

## 2023-01-11 MED ORDER — RABIES VACCINE, PCEC IM SUSR
1.0000 mL | Freq: Once | INTRAMUSCULAR | Status: AC
  Administered 2023-01-11: 1 mL via INTRAMUSCULAR
  Filled 2023-01-11: qty 1

## 2023-01-11 NOTE — Discharge Instructions (Addendum)
You will need additional rabies vaccines in 3 days, 7 days and 14 days from now.  You may follow-up with urgent care or the emergency department for this vaccine.  There will only be 1 shot each day unlike today with multiple shots.  Please take antibiotics until finished.

## 2023-01-11 NOTE — ED Provider Notes (Signed)
[facility] Provider Note  Patient Contact: 3:55 PM (approximate)   History   Animal Bite   HPI  Andre Lowery is a 47 y.o. male who presents emergency department complaining of 2 bites to his legs from a fox.  Patient and his relative were intact by his fox during the day.  Patient reports that the animal was able to be killed.  Denies any other injuries or complaints.  Unsure last tetanus shot.  Multiple punctures reported but no active bleeding.  Still ambulatory in the lower extremities.     Physical Exam   Triage Vital Signs: ED Triage Vitals  Encounter Vitals Group     BP 01/11/23 1451 (!) 154/98     Systolic BP Percentile --      Diastolic BP Percentile --      Pulse Rate 01/11/23 1451 91     Resp 01/11/23 1451 16     Temp 01/11/23 1451 98.9 F (37.2 C)     Temp Source 01/11/23 1451 Oral     SpO2 01/11/23 1451 94 %     Weight 01/11/23 1430 268 lb 15.4 oz (122 kg)     Height 01/11/23 1430 5\' 6"  (1.676 m)     Head Circumference --      Peak Flow --      Pain Score 01/11/23 1430 0     Pain Loc --      Pain Education --      Exclude from Growth Chart --     Most recent vital signs: Vitals:   01/11/23 1451  BP: (!) 154/98  Pulse: 91  Resp: 16  Temp: 98.9 F (37.2 C)  SpO2: 94%     General: Alert and in no acute distress.  Cardiovascular:  Good peripheral perfusion Respiratory: Normal respiratory effort without tachypnea or retractions. Lungs CTAB.  Musculoskeletal: Full range of motion to all extremities.  Puncture wounds noted to the right leg.  1 small laceration is appreciated.  None of these wounds are too large to heal and sutures are not recommended.  Pulses sensation intact distally. Neurologic:  No gross focal neurologic deficits are appreciated.  Skin:   No rash noted Other:   ED Results / Procedures / Treatments   Labs (all labs ordered are listed, but only abnormal results are displayed) Labs Reviewed -  No data to display   EKG     RADIOLOGY    No results found.  PROCEDURES:  Critical Care performed: No  Procedures   MEDICATIONS ORDERED IN ED: Medications  Tdap (BOOSTRIX) injection 0.5 mL (0.5 mLs Intramuscular Given 01/11/23 1659)  rabies vaccine (RABAVERT) injection 1 mL (1 mL Intramuscular Given 01/11/23 1658)  rabies immune globulin (HYPERRAB/KEDRAB) injection 1,500 Units (1,500 Units Infiltration Given 01/11/23 1701)    And  rabies immune globulin (HYPERRAB/KEDRAB) injection 975 Units (975 Units Infiltration Given 01/11/23 1709)     IMPRESSION / MDM / ASSESSMENT AND PLAN / ED COURSE  I reviewed the triage vital signs and the nursing notes.                                 Differential diagnosis includes, but is not limited to, animal bite, need for rabies, wound infection, tendon laceration   Patient's presentation is most consistent with acute presentation with potential threat to life or bodily function.   Patient's diagnosis is consistent with animal bite, need  for rabies prophylaxis.  Patient presents emergency department after being bitten by a fox during the daytime hours.  Patient has puncture wounds to the leg consistent with animal bite.  Animal was killed.  Given this type of tach high concern for rabies excess and he will undergo the rabies prophylaxis series.  Patient had wounds cleansed, will be started on antibiotics prophylactically.  Patient should follow-up in 3 days for second rabies vaccine.  Concerning signs and symptoms regarding his wounds are discussed.  Again follow-up in 3 days for second rabies vaccine..  Patient is given ED precautions to return to the ED for any worsening or new symptoms.     FINAL CLINICAL IMPRESSION(S) / ED DIAGNOSES   Final diagnoses:  Animal bite  Need for post exposure prophylaxis for rabies     Rx / DC Orders   ED Discharge Orders     None        Note:  This document was prepared using Dragon voice  recognition software and may include unintentional dictation errors.   Lanette Hampshire 01/11/23 1715    Dionne Bucy, MD 01/11/23 1907

## 2023-01-11 NOTE — ED Triage Notes (Signed)
Pt reports was in the back yard and a fox attacked him. Pt with bite marks to right ankle, slight bleeding.

## 2023-01-14 ENCOUNTER — Ambulatory Visit
Admission: EM | Admit: 2023-01-14 | Discharge: 2023-01-14 | Disposition: A | Discharge status: Home / Self Care | Payer: BC Managed Care – PPO | Attending: Internal Medicine | Admitting: Internal Medicine

## 2023-01-14 DIAGNOSIS — Z203 Contact with and (suspected) exposure to rabies: Secondary | ICD-10-CM

## 2023-01-14 DIAGNOSIS — Z23 Encounter for immunization: Secondary | ICD-10-CM | POA: Diagnosis not present

## 2023-01-14 MED ORDER — RABIES VACCINE, PCEC IM SUSR
1.0000 mL | Freq: Once | INTRAMUSCULAR | Status: AC
  Administered 2023-01-14: 1 mL via INTRAMUSCULAR

## 2023-01-14 NOTE — Discharge Instructions (Signed)
Follow up as discussed at original ED visit. Day 1, Day 3, Day 7 and Day 14 from original injury date.

## 2023-01-14 NOTE — ED Triage Notes (Signed)
2nd Rabies Vaccine. Nurse visit only per Nurse manager. No Concerns. Next Vaccine (7 days after 1st/bite). B. Roten CMA

## 2023-01-18 ENCOUNTER — Ambulatory Visit
Admission: EM | Admit: 2023-01-18 | Discharge: 2023-01-18 | Disposition: A | Discharge status: Home / Self Care | Payer: BC Managed Care – PPO | Attending: Emergency Medicine | Admitting: Emergency Medicine

## 2023-01-18 DIAGNOSIS — Z203 Contact with and (suspected) exposure to rabies: Secondary | ICD-10-CM

## 2023-01-18 MED ORDER — RABIES VACCINE, PCEC IM SUSR
1.0000 mL | Freq: Once | INTRAMUSCULAR | Status: AC
  Administered 2023-01-18: 1 mL via INTRAMUSCULAR

## 2023-01-18 NOTE — ED Triage Notes (Signed)
Pt presents for the 3rd rabies injection  Pt was attacked and bitten by a fox  Pt voiced no concerns or questions at time of injection.  Pt voiced understanding that he will arrive for his 4th injection on 07.28.24.

## 2023-01-25 ENCOUNTER — Ambulatory Visit
Admission: EM | Admit: 2023-01-25 | Discharge: 2023-01-25 | Disposition: A | Discharge status: Home / Self Care | Payer: BC Managed Care – PPO | Attending: Emergency Medicine | Admitting: Emergency Medicine

## 2023-01-25 DIAGNOSIS — Z23 Encounter for immunization: Secondary | ICD-10-CM | POA: Diagnosis not present

## 2023-01-25 DIAGNOSIS — Z203 Contact with and (suspected) exposure to rabies: Secondary | ICD-10-CM | POA: Diagnosis not present

## 2023-01-25 MED ORDER — RABIES VACCINE, PCEC IM SUSR
1.0000 mL | Freq: Once | INTRAMUSCULAR | Status: AC
  Administered 2023-01-25: 1 mL via INTRAMUSCULAR

## 2023-01-25 NOTE — ED Triage Notes (Signed)
Presents for final rabies vaccine in rabies series following a fox bite 7/14.   Denies any other complaints. Tolerated administration of other rabies vaccinations without issues.

## 2023-01-27 ENCOUNTER — Other Ambulatory Visit (INDEPENDENT_AMBULATORY_CARE_PROVIDER_SITE_OTHER): Payer: Self-pay

## 2023-01-27 ENCOUNTER — Encounter: Payer: BC Managed Care – PPO | Admitting: Family

## 2023-01-30 MED ORDER — ALPRAZOLAM 0.5 MG PO TABS: ORAL_TABLET | ORAL | 0 refills | Status: DC

## 2023-02-17 ENCOUNTER — Encounter: Payer: BC Managed Care – PPO | Admitting: Family

## 2023-03-12 ENCOUNTER — Other Ambulatory Visit (INDEPENDENT_AMBULATORY_CARE_PROVIDER_SITE_OTHER): Payer: Self-pay | Admitting: Vascular Surgery

## 2023-03-12 DIAGNOSIS — I83812 Varicose veins of left lower extremities with pain: Secondary | ICD-10-CM

## 2023-03-13 ENCOUNTER — Ambulatory Visit (INDEPENDENT_AMBULATORY_CARE_PROVIDER_SITE_OTHER): Payer: BC Managed Care – PPO | Admitting: Vascular Surgery

## 2023-03-13 ENCOUNTER — Encounter (INDEPENDENT_AMBULATORY_CARE_PROVIDER_SITE_OTHER): Payer: Self-pay | Admitting: Vascular Surgery

## 2023-03-13 VITALS — BP 126/84 | HR 80 | Resp 16 | Wt 279.6 lb

## 2023-03-13 DIAGNOSIS — I83893 Varicose veins of bilateral lower extremities with other complications: Secondary | ICD-10-CM

## 2023-03-13 NOTE — Progress Notes (Signed)
  Andre Lowery is a 47 y.o. male who presents with symptomatic venous reflux  No past medical history on file.  Past Surgical History:  Procedure Laterality Date   CHOLECYSTECTOMY     2015     Current Outpatient Medications:    ALPRAZolam (XANAX) 0.5 MG tablet, Take 1 tab one hour before procedure and 1 tab when you arrive in office (Patient not taking: Reported on 03/13/2023), Disp: 2 tablet, Rfl: 0  No Known Allergies   Varicose veins of bilateral lower extremities with other complications     PLAN: The patient's left lower extremity was sterilely prepped and draped. The ultrasound machine was used to visualize the saphenous vein throughout its course. A segment in the mid to upper calf was selected for access. The saphenous vein was accessed without difficulty using ultrasound guidance with a micropuncture needle. A 0.018 wire was then placed beyond the saphenofemoral junction and the needle was removed. The 65 cm sheath was then placed over the wire and the wire and dilator were removed. The laser fiber was then placed through the sheath and its tip was placed approximately 5 centimeters below the saphenofemoral junction. Tumescent anesthesia was then created with a dilute lidocaine solution. Laser energy was then delivered with constant withdrawal of the sheath and laser fiber. Approximately 1469 joules of energy were delivered over a length of 37 centimeters using a 1470 Hz VenaCure machine at 7 W. Sterile dressings were placed. The patient tolerated the procedure well without obvious complications.   Follow-up in 1 week with post-laser duplex.

## 2023-03-20 ENCOUNTER — Ambulatory Visit (INDEPENDENT_AMBULATORY_CARE_PROVIDER_SITE_OTHER): Payer: BC Managed Care – PPO

## 2023-03-20 DIAGNOSIS — I83812 Varicose veins of left lower extremities with pain: Secondary | ICD-10-CM

## 2023-04-09 ENCOUNTER — Telehealth (INDEPENDENT_AMBULATORY_CARE_PROVIDER_SITE_OTHER): Payer: Self-pay

## 2023-04-09 NOTE — Telephone Encounter (Signed)
P called back to R/S apt on Friday with Vivia [place] I tried to call pt back with no answer.

## 2023-04-10 ENCOUNTER — Ambulatory Visit (INDEPENDENT_AMBULATORY_CARE_PROVIDER_SITE_OTHER): Payer: 59 | Admitting: Nurse Practitioner

## 2023-05-05 ENCOUNTER — Ambulatory Visit (INDEPENDENT_AMBULATORY_CARE_PROVIDER_SITE_OTHER): Payer: 59 | Admitting: Nurse Practitioner

## 2023-05-12 ENCOUNTER — Encounter: Payer: BC Managed Care – PPO | Admitting: Family

## 2023-06-04 ENCOUNTER — Ambulatory Visit (INDEPENDENT_AMBULATORY_CARE_PROVIDER_SITE_OTHER): Payer: BC Managed Care – PPO | Admitting: Nurse Practitioner

## 2023-06-04 ENCOUNTER — Encounter (INDEPENDENT_AMBULATORY_CARE_PROVIDER_SITE_OTHER): Payer: Self-pay | Admitting: Nurse Practitioner

## 2023-06-04 VITALS — BP 144/89 | HR 76 | Resp 18 | Ht 65.0 in | Wt 282.2 lb

## 2023-06-04 DIAGNOSIS — E782 Mixed hyperlipidemia: Secondary | ICD-10-CM

## 2023-06-04 DIAGNOSIS — I83893 Varicose veins of bilateral lower extremities with other complications: Secondary | ICD-10-CM | POA: Diagnosis not present

## 2023-06-08 NOTE — Progress Notes (Signed)
Subjective:    Patient ID: Andre Lowery, male    DOB: 04-20-1976, 47 y.o.   MRN: [id number] Chief Complaint  Patient presents with   Follow-up    4 week follow up     The patient returns to the office for followup status post laser ablation of the left great saphenous vein on 03/13/2023. The patient notes multiple residual varicosities bilaterally which continued to hurt with dependent positions and remained tender to palpation. The patient's swelling is unchanged from preoperative status. The patient continues to wear graduated compression stockings on a daily basis but these are not eliminating the pain and discomfort. The patient continues to use over-the-counter anti-inflammatory medications to treat the pain and related symptoms but this has not given the patient relief. The patient notes the pain in the lower extremities is causing problems with daily exercise, problems at work and even with household activities such as preparing meals and doing dishes.  The patient is otherwise done well and there have been no complications related to the laser procedure or interval changes in the patient's overall   Venous ultrasound post laser shows successful laser ablation of the left gsv , no DVT identified.   Review of Systems  Cardiovascular:  Positive for leg swelling.  All other systems reviewed and are negative.      Objective:   Physical Exam Vitals reviewed.  HENT:     Head: Normocephalic.  Cardiovascular:     Rate and Rhythm: Normal rate.     Pulses: Normal pulses.  Pulmonary:     Effort: Pulmonary effort is normal.  Musculoskeletal:        General: Tenderness present.     Left lower leg: Edema present.  Skin:    General: Skin is warm and dry.  Neurological:     Mental Status: He is alert and oriented to person, place, and time.  Psychiatric:        Mood and Affect: Mood normal.        Behavior: Behavior normal.        Thought Content: Thought content normal.         Judgment: Judgment normal.     BP (!) 144/89 (BP Location: Right Arm)   Pulse 76   Resp 18   Ht 5\' 5"  (1.651 m)   Wt 282 lb 3.2 oz (128 kg)   BMI 46.96 kg/m   History reviewed. No pertinent past medical history.  Social History   Socioeconomic History   Marital status: Single    Spouse name: Not on file   Number of children: 3   Years of education: Not on file   Highest education level: Not on file  Occupational History   Occupation: Pharmacist, hospital  Tobacco Use   Smoking status: Never   Smokeless tobacco: Never  Vaping Use   Vaping status: Never Used  Substance and Sexual Activity   Alcohol use: Not Currently    Comment: Stop 8 years ago   Drug use: Never   Sexual activity: Not Currently    Comment: single at current  Other Topics Concern   Not on file  Social History Narrative   63, 75 , two boys, and daughter 66.    Will have first grandchild , boy, in 9/23.    Social Determinants of Health   Financial Resource Strain: Not on file  Food Insecurity: Not on file  Transportation Needs: Not on file  Physical Activity: Not on file  Stress: Not  on file  Social Connections: Not on file  Intimate Partner Violence: Not on file    Past Surgical History:  Procedure Laterality Date   CHOLECYSTECTOMY     2015    Family History  Problem Relation Age of Onset   High Cholesterol Mother    Hypertension Mother     No Known Allergies     Latest Ref Rng & Units 12/23/2021   12:00 AM 11/08/2013    8:27 AM 11/01/2013    8:32 AM  CBC  WBC 3.8 - 10.6 x10 3/mm 3  3.7  6.2   Hemoglobin 13.5 - 17.5 14.6     13.2  13.0   Hematocrit 41 - 53 45     41.0  40.0   Platelets 150 - 400 K/uL 262     343  639      This result is from an external source.      CMP     Component Value Date/Time   NA 138 12/23/2021 0000   NA 137 11/01/2013 0832   K 4.3 11/01/2013 0832   CL 103 12/23/2021 0000   CL 103 11/01/2013 0832   CO2 27 11/01/2013 0832   GLUCOSE 109 (H)  11/01/2013 0832   BUN 11 12/23/2021 0000   BUN 7 11/01/2013 0832   CREATININE 1.0 12/23/2021 0000   CREATININE 1.07 11/01/2013 0832   CALCIUM 9.3 12/23/2021 0000   CALCIUM 10.0 11/01/2013 0832   PROT 8.7 (H) 11/01/2013 0832   ALBUMIN 4.5 12/23/2021 0000   ALBUMIN 3.7 11/01/2013 0832   AST 14 12/23/2021 0000   AST 29 11/01/2013 0832   ALT 26 12/23/2021 0000   ALT 54 11/01/2013 0832   ALKPHOS 108 12/23/2021 0000   ALKPHOS 110 11/01/2013 0832   BILITOT 0.8 11/01/2013 0832   EGFR 102 12/23/2021 0000   GFRNONAA >60 11/01/2013 0832     No results found.     Assessment & Plan:   1. Varicose veins of bilateral lower extremities with other complications Recommend:  The patient has had successful ablation of the previously incompetent saphenous venous system but still has persistent symptoms of pain and swelling that are having a negative impact on daily life and daily activities.  Patient should undergo injection foam sclerotherapy to treat the residual varicosities.  The risks, benefits and alternative therapies were reviewed in detail with the patient.  All questions were answered.  The patient agrees to proceed with foam sclerotherapy at their convenience.  The patient will continue wearing the graduated compression stockings and using the over-the-counter pain medications to treat her symptoms.      2. Mixed hyperlipidemia Continue statin as ordered and reviewed, no changes at this time   Current Outpatient Medications on File Prior to Visit  Medication Sig Dispense Refill   ALPRAZolam (XANAX) 0.5 MG tablet Take 1 tab one hour before procedure and 1 tab when you arrive in office 2 tablet 0   No current facility-administered medications on file prior to visit.    There are no Patient Instructions on file for this visit. No follow-ups on file.   Georgiana Spinner, NP

## 2023-07-10 ENCOUNTER — Ambulatory Visit (INDEPENDENT_AMBULATORY_CARE_PROVIDER_SITE_OTHER): Payer: BC Managed Care – PPO | Admitting: Family

## 2023-07-10 ENCOUNTER — Encounter: Payer: Self-pay | Admitting: Family

## 2023-07-10 VITALS — BP 132/86 | HR 74 | Temp 98.1°F | Ht 66.0 in | Wt 283.8 lb

## 2023-07-10 DIAGNOSIS — Z13220 Encounter for screening for lipoid disorders: Secondary | ICD-10-CM | POA: Diagnosis not present

## 2023-07-10 DIAGNOSIS — R351 Nocturia: Secondary | ICD-10-CM | POA: Diagnosis not present

## 2023-07-10 DIAGNOSIS — Z1211 Encounter for screening for malignant neoplasm of colon: Secondary | ICD-10-CM | POA: Insufficient documentation

## 2023-07-10 DIAGNOSIS — I83893 Varicose veins of bilateral lower extremities with other complications: Secondary | ICD-10-CM

## 2023-07-10 DIAGNOSIS — Z0000 Encounter for general adult medical examination without abnormal findings: Secondary | ICD-10-CM

## 2023-07-10 DIAGNOSIS — E781 Pure hyperglyceridemia: Secondary | ICD-10-CM

## 2023-07-10 DIAGNOSIS — E66813 Obesity, class 3: Secondary | ICD-10-CM

## 2023-07-10 DIAGNOSIS — H538 Other visual disturbances: Secondary | ICD-10-CM

## 2023-07-10 DIAGNOSIS — R7303 Prediabetes: Secondary | ICD-10-CM

## 2023-07-10 DIAGNOSIS — Z6841 Body Mass Index (BMI) 40.0 and over, adult: Secondary | ICD-10-CM

## 2023-07-10 DIAGNOSIS — E782 Mixed hyperlipidemia: Secondary | ICD-10-CM

## 2023-07-10 LAB — LIPID PANEL
Cholesterol: 183 mg/dL (ref 0–200)
HDL: 46.1 mg/dL (ref >39.00)
LDL Cholesterol: 114 mg/dL — ABNORMAL HIGH (ref 0–99)
NonHDL: 136.64
Total CHOL/HDL Ratio: 4
Triglycerides: 113 mg/dL (ref 0.0–149.0)
VLDL: 22.6 mg/dL (ref 0.0–40.0)

## 2023-07-10 LAB — BASIC METABOLIC PANEL
BUN: 17 mg/dL (ref 6–23)
CO2: 29 meq/L (ref 19–32)
Calcium: 9.4 mg/dL (ref 8.4–10.5)
Chloride: 104 meq/L (ref 96–112)
Creatinine, Ser: 0.99 mg/dL (ref 0.40–1.50)
GFR: 92 mL/min (ref >60.00)
Glucose, Bld: 87 mg/dL (ref 70–99)
Potassium: 4.2 meq/L (ref 3.5–5.1)
Sodium: 141 meq/L (ref 135–145)

## 2023-07-10 LAB — CBC
HCT: 46.5 % (ref 39.0–52.0)
Hemoglobin: 15.2 g/dL (ref 13.0–17.0)
MCHC: 32.7 g/dL (ref 30.0–36.0)
MCV: 87.3 fL (ref 78.0–100.0)
Platelets: 267 10*3/uL (ref 150.0–400.0)
RBC: 5.33 Mil/uL (ref 4.22–5.81)
RDW: 13.6 % (ref 11.5–15.5)
WBC: 4.4 10*3/uL (ref 4.0–10.5)

## 2023-07-10 LAB — PSA: PSA: 1.63 ng/mL (ref 0.10–4.00)

## 2023-07-10 LAB — HEMOGLOBIN A1C: Hgb A1c MFr Bld: 6.1 % (ref 4.6–6.5)

## 2023-07-10 NOTE — Patient Instructions (Signed)
  A referral was placed today for a colonoscopy  Please let us know if you have not heard back within 2 weeks about the referral.  Make appointment for eye exam and establish with a dentist.

## 2023-07-10 NOTE — Assessment & Plan Note (Signed)
 Pt advised of the following: Work on a diabetic diet, try to incorporate exercise at least 20-30 a day for 3 days a week or more.

## 2023-07-10 NOTE — Addendum Note (Signed)
 Addended by: Mort Sawyers on: 07/10/2023 10:57 AM   Modules accepted: Orders

## 2023-07-10 NOTE — Assessment & Plan Note (Signed)
 Psa ordered today urine dip today  Pending results Could consider tamsulosin Advised conservative measures to see if this helps first (dont drink much water after 6 pm prior to bed, just sips)

## 2023-07-10 NOTE — Assessment & Plan Note (Signed)
 Cont f/u with vascular surgeon

## 2023-07-10 NOTE — Assessment & Plan Note (Signed)
 Pt advised to work on diet and exercise as tolerated

## 2023-07-10 NOTE — Assessment & Plan Note (Signed)
 Ordered lipid panel, pending results. Work on low cholesterol diet and exercise as tolerated

## 2023-07-10 NOTE — Progress Notes (Signed)
 Subjective:  Patient ID: Andre Lowery, male    DOB: [birth year]  Age: 48 y.o. MRN: [id number]  Patient Care Team: Corwin Antu, FNP as PCP - General (Family Medicine)   CC:  Chief Complaint  Patient presents with   Annual Exam    HPI Andre Lowery is a 48 y.o. male who presents today for an annual physical exam. He reports consuming a general diet. The patient does not participate in regular exercise at present. He generally feels well. He reports sleeping fairly well. He does have additional problems to discuss today.   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 At times throughout the night will wake up middle of the night.   Vision:Not within last year Dental:No regular dental care  Blurry vision, but states more related to screen use. He is overdue for eye exam.   Varicose veins, getting laser treatments with vascular doctor.  Pending appt f/u.   Colonoscopy: never had.   Pt is with acute concerns.  Increased nocturia at night time. He states waking up to go about twice at night time. Not hard to start a stream of urine but does feel he has urinary retention. Does drink water right before bed.   He does state was seen at occupational health and states triglycerides were very slightly high.   Advanced Directives Patient does have advanced directives    DEPRESSION SCREENING    07/10/2023   10:12 AM 07/24/2022    2:19 PM 01/09/2022    3:07 PM  PHQ 2/9 Scores  PHQ - 2 Score 0 6 1  PHQ- 9 Score 0 11 6     ROS: Negative unless specifically indicated above in HPI.   No current outpatient medications on file.    Objective:    BP 132/86 (BP Location: Left Arm, Patient Position: Sitting, Cuff Size: Large)   Pulse 74   Temp 98.1 F (36.7 C) (Temporal)   Ht 5' 6 (1.676 m)   Wt 283 lb 12.8 oz (128.7 kg)   SpO2 100%   BMI 45.81 kg/m   BP Readings from Last 3 Encounters:  07/10/23 132/86  06/04/23 (!) 144/89  03/13/23 126/84       Physical Exam Vitals reviewed.  Constitutional:      General: He is not in acute distress.    Appearance: Normal appearance. He is obese. He is not ill-appearing, toxic-appearing or diaphoretic.  HENT:     Head: Normocephalic.     Right Ear: Tympanic membrane normal.     Left Ear: Tympanic membrane normal.     Nose: Nose normal.     Mouth/Throat:     Mouth: Mucous membranes are moist.  Eyes:     Pupils: Pupils are equal, round, and reactive to light.  Cardiovascular:     Rate and Rhythm: Normal rate and regular rhythm.  Pulmonary:     Effort: Pulmonary effort is normal.     Breath sounds: Normal breath sounds.  Abdominal:     General: Abdomen is flat.     Tenderness: There is no abdominal tenderness.  Musculoskeletal:        General: Normal range of motion.  Skin:    General: Skin is warm.  Neurological:     General: No focal deficit present.     Mental Status: He is alert and oriented to person, place, and time. Mental status is at baseline.  Psychiatric:        Mood  and Affect: Mood normal.        Behavior: Behavior normal.        Thought Content: Thought content normal.        Judgment: Judgment normal.          Assessment & Plan:  Blurry vision, bilateral  Screening for colon cancer -     Ambulatory referral to Gastroenterology  Encounter for general adult medical examination without abnormal findings Assessment & Plan: Patient Counseling(The following topics were reviewed):  Preventative care handout given to pt  Health maintenance and immunizations reviewed. Please refer to Health maintenance section. Pt advised on safe sex, wearing seatbelts in car, and proper nutrition labwork ordered today for annual Dental health: Discussed importance of regular tooth brushing, flossing, and dental visits.   Orders: -     Ambulatory referral to Gastroenterology -     PSA -     Hemoglobin A1c -     Lipid panel -     CBC -     Basic metabolic  panel  Nocturia Assessment & Plan: Psa ordered today urine dip today  Pending results Could consider tamsulosin Advised conservative measures to see if this helps first (dont drink much water after 6 pm prior to bed, just sips)   Orders: -     PSA  Screening for lipoid disorders -     Lipid panel  Hypertriglyceridemia  Class 3 severe obesity due to excess calories without serious comorbidity with body mass index (BMI) of 40.0 to 44.9 in adult [facility]) Assessment & Plan: Pt advised to work on diet and exercise as tolerated    Mixed hyperlipidemia Assessment & Plan: Ordered lipid panel, pending results. Work on low cholesterol diet and exercise as tolerated    Prediabetes Assessment & Plan: Pt advised of the following: Work on a diabetic diet, try to incorporate exercise at least 20-30 a day for 3 days a week or more.      Varicose veins of bilateral lower extremities with other complications Assessment & Plan: Cont f/u with vascular surgeon        Follow-up: Return in about 1 year (around 07/09/2024) for f/u CPE.   Ginger Patrick, FNP

## 2023-07-10 NOTE — Assessment & Plan Note (Signed)

## 2023-07-11 LAB — URINALYSIS W MICROSCOPIC + REFLEX CULTURE
Bacteria, UA: NONE SEEN /[HPF]
Bilirubin Urine: NEGATIVE
Glucose, UA: NEGATIVE
Hgb urine dipstick: NEGATIVE
Hyaline Cast: NONE SEEN /[LPF]
Ketones, ur: NEGATIVE
Leukocyte Esterase: NEGATIVE
Nitrites, Initial: NEGATIVE
Protein, ur: NEGATIVE
RBC / HPF: NONE SEEN /[HPF] (ref 0–2)
Specific Gravity, Urine: 1.02 (ref 1.001–1.035)
Squamous Epithelial / HPF: NONE SEEN /[HPF] (ref <=5)
WBC, UA: NONE SEEN /[HPF] (ref 0–5)
pH: 5.5 (ref 5.0–8.0)

## 2023-07-11 LAB — NO CULTURE INDICATED

## 2023-07-13 ENCOUNTER — Telehealth: Payer: Self-pay | Admitting: Family

## 2023-07-13 NOTE — Telephone Encounter (Signed)
 Since your urine came back normal and your prostate came back normal, I am going to give it some time for you to try cutting down the water at night time but if you still notice urinary retention and night time frequency let me know and we can consider a medication to make this get better.   (Please reach out to pt didn't realize he didn't have mychart when I sent this message)

## 2023-07-13 NOTE — Telephone Encounter (Signed)
 Documented this information in the pt's result note documentation.

## 2023-07-13 NOTE — Progress Notes (Signed)
 Whoops forgot to comment on the rest.  He is prediabetic, number is rising to 6.1 work on diabetic diet as well.  Labs otherwise reassuring.

## 2023-07-13 NOTE — Progress Notes (Signed)
 Cholesterol mildly elevated . Work on low cholesterol diet an exercise as tolerated.

## 2023-07-14 ENCOUNTER — Telehealth: Payer: Self-pay

## 2023-07-14 ENCOUNTER — Other Ambulatory Visit: Payer: Self-pay

## 2023-07-14 DIAGNOSIS — Z1211 Encounter for screening for malignant neoplasm of colon: Secondary | ICD-10-CM

## 2023-07-14 MED ORDER — NA SULFATE-K SULFATE-MG SULF 17.5-3.13-1.6 GM/177ML PO SOLN: 1.0000 | Freq: Once | ORAL | 0 refills | Status: AC

## 2023-07-14 NOTE — Telephone Encounter (Signed)
 Gastroenterology Pre-Procedure Review  Request Date: 08/14/23 Requesting Physician: Dr.Anna  PATIENT REVIEW QUESTIONS: The patient responded to the following health history questions as indicated:    1. Are you having any GI issues? no 2. Do you have a personal history of Polyps? no 3. Do you have a family history of Colon Cancer or Polyps? no 4. Diabetes Mellitus? no 5. Joint replacements in the past 12 months?no 6. Major health problems in the past 3 months?no 7. Any artificial heart valves, MVP, or defibrillator?no    MEDICATIONS & ALLERGIES:    Patient reports the following regarding taking any anticoagulation/antiplatelet therapy:   Plavix, Coumadin, Eliquis, Xarelto, Lovenox, Pradaxa, Brilinta, or Effient? no Aspirin? no  Patient confirms/reports the following medications:  No current outpatient medications on file.   No current facility-administered medications for this visit.    Patient confirms/reports the following allergies:  No Known Allergies  No orders of the defined types were placed in this encounter.   AUTHORIZATION INFORMATION Primary Insurance: 1D#: Group #:  Secondary Insurance: 1D#: Group #:  SCHEDULE INFORMATION: Date: 08/14/23 Time: Location: [facility]

## 2023-07-15 ENCOUNTER — Encounter: Payer: Self-pay | Admitting: Family

## 2023-08-14 ENCOUNTER — Encounter: Payer: Self-pay | Admitting: Gastroenterology

## 2023-08-14 ENCOUNTER — Ambulatory Visit: Payer: BC Managed Care – PPO | Admitting: Certified Registered"

## 2023-08-14 ENCOUNTER — Ambulatory Visit
Admission: RE | Admit: 2023-08-14 | Discharge: 2023-08-14 | Disposition: A | Discharge status: Home / Self Care | Payer: BC Managed Care – PPO | Attending: Gastroenterology | Admitting: Gastroenterology

## 2023-08-14 ENCOUNTER — Encounter
Admission: RE | Disposition: A | Discharge status: Home / Self Care | Payer: Self-pay | Source: Home / Self Care | Attending: Gastroenterology

## 2023-08-14 DIAGNOSIS — Z1211 Encounter for screening for malignant neoplasm of colon: Secondary | ICD-10-CM

## 2023-08-14 DIAGNOSIS — E782 Mixed hyperlipidemia: Secondary | ICD-10-CM | POA: Diagnosis not present

## 2023-08-14 HISTORY — PX: COLONOSCOPY WITH PROPOFOL: SHX5780

## 2023-08-14 SURGERY — COLONOSCOPY WITH PROPOFOL
Anesthesia: General

## 2023-08-14 MED ORDER — PROPOFOL 10 MG/ML IV BOLUS
INTRAVENOUS | Status: AC
  Filled 2023-08-14: qty 40

## 2023-08-14 MED ORDER — LIDOCAINE HCL (PF) 2 % IJ SOLN
INTRAMUSCULAR | Status: AC
  Filled 2023-08-14: qty 5

## 2023-08-14 MED ORDER — LIDOCAINE HCL (CARDIAC) PF 100 MG/5ML IV SOSY
PREFILLED_SYRINGE | INTRAVENOUS | Status: DC | PRN
  Administered 2023-08-14: 100 mg via INTRAVENOUS

## 2023-08-14 MED ORDER — SODIUM CHLORIDE 0.9 % IV SOLN
INTRAVENOUS | Status: DC
  Administered 2023-08-14: 500 mL via INTRAVENOUS

## 2023-08-14 MED ORDER — PROPOFOL 10 MG/ML IV BOLUS
INTRAVENOUS | Status: DC | PRN
Start: 2023-08-14 — End: 2023-08-14
  Administered 2023-08-14: 150 mg via INTRAVENOUS
  Administered 2023-08-14: 105 ug/kg/min via INTRAVENOUS

## 2023-08-14 NOTE — Transfer of Care (Signed)
Immediate Anesthesia Transfer of Care Note  Patient: Andre Lowery [facility]  Procedure(s) Performed: COLONOSCOPY WITH PROPOFOL  Patient Location: Endoscopy Unit  Anesthesia Type:General  Level of Consciousness: drowsy  Airway & Oxygen Therapy: Patient Spontanous Breathing  Post-op Assessment: Report given to RN and Post -op Vital signs reviewed and stable  Post vital signs: Reviewed and stable  Last Vitals:  Vitals Value Taken Time  BP 105/78 08/14/23 1036  Temp 35.7 C 08/14/23 1036  Pulse 88 08/14/23 1036  Resp 17 08/14/23 1036  SpO2 92 % 08/14/23 1036  Vitals shown include unfiled device data.  Last Pain:  Vitals:   08/14/23 1036  TempSrc: Temporal  PainSc: Asleep         Complications: No notable events documented.

## 2023-08-14 NOTE — Op Note (Signed)
[facility] Gastroenterology Patient Name: Andre Lowery [facility] Procedure Date: 08/14/2023 10:11 AM MRN: [id number] Account #: [id number] Date of Birth: February 12, 1976 Admit Type: Outpatient Age: 48 Room: [facility] ENDO ROOM 2 Gender: Male Note Status: Finalized Instrument Name: Prentice Docker [id number] Procedure:             Colonoscopy Indications:           Screening for colorectal malignant neoplasm Providers:             Wyline Mood MD, MD Referring MD:          Mort Sawyers (Referring MD) Medicines:             Monitored Anesthesia Care Complications:         No immediate complications. Procedure:             Pre-Anesthesia Assessment:                        - Prior to the procedure, a History and Physical was                         performed, and patient medications, allergies and                         sensitivities were reviewed. The patient's tolerance                         of previous anesthesia was reviewed.                        - The risks and benefits of the procedure and the                         sedation options and risks were discussed with the                         patient. All questions were answered and informed                         consent was obtained.                        - ASA Grade Assessment: II - A patient with mild                         systemic disease.                        After obtaining informed consent, the colonoscope was                         passed under direct vision. Throughout the procedure,                         the patient's blood pressure, pulse, and oxygen                         saturations were monitored continuously. The                         Colonoscope was introduced  through the anus and                         advanced to the the cecum, identified by the                         appendiceal orifice. The colonoscopy was performed                         with ease. The patient tolerated the procedure  well.                         The quality of the bowel preparation was excellent.                         The ileocecal valve, appendiceal orifice, and rectum                         were photographed. Findings:      The entire examined colon appeared normal on direct and retroflexion       views. Impression:            - The entire examined colon is normal on direct and                         retroflexion views.                        - No specimens collected. Recommendation:        - Discharge patient to home.                        - Resume previous diet.                        - Continue present medications.                        - Repeat colonoscopy in 10 years for screening                         purposes. Procedure Code(s):     --- Professional ---                        [phone], Colonoscopy, flexible; diagnostic, including                         collection of specimen(s) by brushing or washing, when                         performed (separate procedure) Diagnosis Code(s):     --- Professional ---                        Z12.11, Encounter for screening for malignant neoplasm                         of colon CPT copyright 2022 American Medical Association. All rights reserved. The codes documented in this report are preliminary and upon coder review may  be revised to meet current compliance requirements.  Wyline Mood, MD Wyline Mood MD, MD 08/14/2023 10:34:35 AM This report has been signed electronically. Number of Addenda: 0 Note Initiated On: 08/14/2023 10:11 AM Scope Withdrawal Time: 0 hours 10 minutes 17 seconds  Total Procedure Duration: 0 hours 12 minutes 32 seconds  Estimated Blood Loss:  Estimated blood loss: none.      [facility]

## 2023-08-14 NOTE — H&P (Signed)
     Wyline Mood, MD [address], [address], [place], [place], [address] [address], [place], [place], [address] Phone: [phone]  Fax: [phone]  Primary Care Physician:  Mort Sawyers, FNP   Pre-Procedure History & Physical: HPI:  Andre Lowery is a 48 y.o. male is here for an colonoscopy.   No past medical history on file.  Past Surgical History:  Procedure Laterality Date   CHOLECYSTECTOMY     2015    Prior to Admission medications   Not on File    Allergies as of 07/14/2023   (No Known Allergies)    Family History  Problem Relation Age of Onset   High Cholesterol Mother    Hypertension Mother     Social History   Socioeconomic History   Marital status: Single    Spouse name: Not on file   Number of children: 3   Years of education: Not on file   Highest education level: Not on file  Occupational History   Occupation: Pharmacist, hospital  Tobacco Use   Smoking status: Never   Smokeless tobacco: Never  Vaping Use   Vaping status: Never Used  Substance and Sexual Activity   Alcohol use: Not Currently    Comment: Stop 8 years ago   Drug use: Never   Sexual activity: Not Currently    Comment: single at current  Other Topics Concern   Not on file  Social History Narrative   73, 89 , two boys, and daughter 38.    Will have first grandchild , boy, in 9/23.    Social Drivers of Corporate investment banker Strain: Not on file  Food Insecurity: Not on file  Transportation Needs: Not on file  Physical Activity: Not on file  Stress: Not on file  Social Connections: Not on file  Intimate Partner Violence: Not on file    Review of Systems: See HPI, otherwise negative ROS  Physical Exam: There were no vitals taken for this visit. General:   Alert,  pleasant and cooperative in NAD Head:  Normocephalic and atraumatic. Neck:  Supple; no masses or thyromegaly. Lungs:  Clear throughout to auscultation, normal respiratory effort.     Heart:  +S1, +S2, Regular rate and rhythm, No edema. Abdomen:  Soft, nontender and nondistended. Normal bowel sounds, without guarding, and without rebound.   Neurologic:  Alert and  oriented x4;  grossly normal neurologically.  Impression/Plan: Andre Lowery is here for an colonoscopy to be performed for Screening colonoscopy average risk   Risks, benefits, limitations, and alternatives regarding  colonoscopy have been reviewed with the patient.  Questions have been answered.  All parties agreeable.   Wyline Mood, MD  08/14/2023, 9:26 AM

## 2023-08-14 NOTE — Anesthesia Preprocedure Evaluation (Signed)
Anesthesia Evaluation  Patient identified by MRN, date of birth, ID band Patient awake    Reviewed: Allergy & Precautions, H&P , NPO status , Patient's Chart, lab work & pertinent test results, reviewed documented beta blocker date and time   History of Anesthesia Complications Negative for: history of anesthetic complications  Airway Mallampati: II  TM Distance: >3 FB Neck ROM: full    Dental  (+) Dental Advidsory Given, Teeth Intact   Pulmonary neg pulmonary ROS   Pulmonary exam normal breath sounds clear to auscultation       Cardiovascular Exercise Tolerance: Good negative cardio ROS Normal cardiovascular exam Rhythm:regular Rate:Normal     Neuro/Psych negative neurological ROS  negative psych ROS   GI/Hepatic negative GI ROS, Neg liver ROS,,,  Endo/Other  diabetes (borderline)  Class 4 obesity  Renal/GU negative Renal ROS  negative genitourinary   Musculoskeletal   Abdominal   Peds  Hematology negative hematology ROS (+)   Anesthesia Other Findings History reviewed. No pertinent past medical history.   Reproductive/Obstetrics negative OB ROS                             Anesthesia Physical Anesthesia Plan  ASA: 3  Anesthesia Plan: General   Post-op Pain Management:    Induction: Intravenous  PONV Risk Score and Plan: 2 and Propofol infusion and TIVA  Airway Management Planned: Natural Airway and Nasal Cannula  Additional Equipment:   Intra-op Plan:   Post-operative Plan:   Informed Consent: I have reviewed the patients History and Physical, chart, labs and discussed the procedure including the risks, benefits and alternatives for the proposed anesthesia with the patient or authorized representative who has indicated his/her understanding and acceptance.     Dental Advisory Given  Plan Discussed with: Anesthesiologist, CRNA and Surgeon  Anesthesia Plan Comments:          Anesthesia Quick Evaluation

## 2023-08-14 NOTE — Anesthesia Postprocedure Evaluation (Signed)
Anesthesia Post Note  Patient: Andre Lowery [facility]  Procedure(s) Performed: COLONOSCOPY WITH PROPOFOL  Patient location during evaluation: Endoscopy Anesthesia Type: General Level of consciousness: awake and alert Pain management: pain level controlled Vital Signs Assessment: post-procedure vital signs reviewed and stable Respiratory status: spontaneous breathing, nonlabored ventilation, respiratory function stable and patient connected to nasal cannula oxygen Cardiovascular status: blood pressure returned to baseline and stable Postop Assessment: no apparent nausea or vomiting Anesthetic complications: no   No notable events documented.   Last Vitals:  Vitals:   08/14/23 1046 08/14/23 1053  BP: (!) 115/90 (!) 129/92  Pulse: 85 76  Resp: 19 17  Temp:    SpO2: 96% 98%    Last Pain:  Vitals:   08/14/23 1053  TempSrc:   PainSc: 0-No pain                 Lenard Simmer

## 2023-08-17 ENCOUNTER — Encounter: Payer: Self-pay | Admitting: Gastroenterology

## 2023-08-26 NOTE — Progress Notes (Unsigned)
   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  Procedure:  Sclerotherapy using SDS foam mixed with 1% Lidocaine was performed on the left lower extremity.  Compression bandages were placed.  The patient tolerated the procedure well.  Plan:  Follow up as needed.

## 2023-08-27 ENCOUNTER — Encounter (INDEPENDENT_AMBULATORY_CARE_PROVIDER_SITE_OTHER): Payer: Self-pay | Admitting: Vascular Surgery

## 2023-08-27 ENCOUNTER — Ambulatory Visit (INDEPENDENT_AMBULATORY_CARE_PROVIDER_SITE_OTHER): Payer: BC Managed Care – PPO | Admitting: Vascular Surgery

## 2023-08-27 VITALS — BP 134/87 | HR 83 | Resp 18 | Ht 64.0 in | Wt 271.0 lb

## 2023-08-27 DIAGNOSIS — I83893 Varicose veins of bilateral lower extremities with other complications: Secondary | ICD-10-CM | POA: Diagnosis not present

## 2023-09-23 NOTE — Progress Notes (Unsigned)
   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  Procedure:  Sclerotherapy using SDS foam mixed with 1% Lidocaine was performed on the left lower extremity.  Compression wraps were placed.  The patient tolerated the procedure well.  Plan:  Follow up as needed.

## 2023-09-24 ENCOUNTER — Encounter (INDEPENDENT_AMBULATORY_CARE_PROVIDER_SITE_OTHER): Payer: Self-pay | Admitting: Vascular Surgery

## 2023-09-24 ENCOUNTER — Ambulatory Visit (INDEPENDENT_AMBULATORY_CARE_PROVIDER_SITE_OTHER): Payer: BC Managed Care – PPO | Admitting: Vascular Surgery

## 2023-09-24 VITALS — BP 138/92 | HR 80 | Resp 18 | Ht 66.0 in | Wt 276.6 lb

## 2023-09-24 DIAGNOSIS — I83893 Varicose veins of bilateral lower extremities with other complications: Secondary | ICD-10-CM | POA: Diagnosis not present

## 2024-07-15 ENCOUNTER — Encounter: Payer: BC Managed Care – PPO | Admitting: Family

## 2025-01-06 ENCOUNTER — Encounter: Admitting: Family
